# Patient Record
Sex: Male | Born: 1970 | Race: White | Hispanic: No | Marital: Married | Smoking: Former smoker
Health system: Southern US, Community
[De-identification: ages and names within clinical notes are randomized; demographics above are authoritative.]

## PROBLEM LIST (undated history)

## (undated) DIAGNOSIS — F32A Depression, unspecified: Secondary | ICD-10-CM

## (undated) DIAGNOSIS — F419 Anxiety disorder, unspecified: Secondary | ICD-10-CM

## (undated) DIAGNOSIS — Z789 Other specified health status: Secondary | ICD-10-CM

## (undated) HISTORY — PX: HERNIA REPAIR: SHX51

## (undated) HISTORY — DX: Anxiety disorder, unspecified: F41.9

## (undated) HISTORY — DX: Depression, unspecified: F32.A

## (undated) HISTORY — PX: WISDOM TOOTH EXTRACTION: SHX21

---

## 2013-09-22 ENCOUNTER — Encounter (HOSPITAL_BASED_OUTPATIENT_CLINIC_OR_DEPARTMENT_OTHER): Payer: Self-pay | Admitting: Emergency Medicine

## 2013-09-22 ENCOUNTER — Emergency Department (HOSPITAL_BASED_OUTPATIENT_CLINIC_OR_DEPARTMENT_OTHER)
Admission: EM | Admit: 2013-09-22 | Discharge: 2013-09-22 | Disposition: A | Discharge status: Home / Self Care | Payer: BC Managed Care – PPO | Attending: Emergency Medicine | Admitting: Emergency Medicine

## 2013-09-22 DIAGNOSIS — IMO0002 Reserved for concepts with insufficient information to code with codable children: Secondary | ICD-10-CM | POA: Insufficient documentation

## 2013-09-22 DIAGNOSIS — Z791 Long term (current) use of non-steroidal anti-inflammatories (NSAID): Secondary | ICD-10-CM | POA: Insufficient documentation

## 2013-09-22 DIAGNOSIS — R209 Unspecified disturbances of skin sensation: Secondary | ICD-10-CM | POA: Insufficient documentation

## 2013-09-22 DIAGNOSIS — M5417 Radiculopathy, lumbosacral region: Secondary | ICD-10-CM

## 2013-09-22 MED ORDER — PREDNISONE 50 MG PO TABS
60.0000 mg | ORAL_TABLET | Freq: Once | ORAL | Status: AC
  Administered 2013-09-22: 60 mg via ORAL
  Filled 2013-09-22 (×2): qty 1

## 2013-09-22 MED ORDER — DEXAMETHASONE SODIUM PHOSPHATE 10 MG/ML IJ SOLN: 10.0000 mg | Freq: Once | INTRAMUSCULAR | Status: DC

## 2013-09-22 MED ORDER — HYDROCODONE-ACETAMINOPHEN 5-325 MG PO TABS: ORAL_TABLET | ORAL | Status: DC

## 2013-09-22 MED ORDER — PREDNISONE 20 MG PO TABS: ORAL_TABLET | ORAL | Status: DC

## 2013-09-22 MED ORDER — MORPHINE SULFATE 4 MG/ML IJ SOLN
4.0000 mg | Freq: Once | INTRAMUSCULAR | Status: AC
  Administered 2013-09-22: 4 mg via INTRAVENOUS
  Filled 2013-09-22: qty 1

## 2013-09-22 NOTE — Discharge Instructions (Signed)
Take vicodin for breakthrough pain, do not drink alcohol, drive, care for children or do other critical tasks while taking vicodin. ° °Please follow with your primary care doctor in the next 2 days for a check-up. They must obtain records for further management.  ° °Do not hesitate to return to the Emergency Department for any new, worsening or concerning symptoms.  ° °

## 2013-09-22 NOTE — ED Notes (Signed)
Pt presents to ED with complaints of right lower back, and  rt leg pain and rt foot numbness, about a month now.

## 2013-09-22 NOTE — ED Notes (Signed)
PA at bedside.

## 2013-09-22 NOTE — ED Provider Notes (Signed)
CSN: [id number]     Arrival date & time 09/22/13  1458 History   First MD Initiated Contact with Patient 09/22/13 1620     Chief Complaint  Patient presents with  . Back Pain  . Leg Pain  . Numbness     (Consider location/radiation/quality/duration/timing/severity/associated sxs/prior Treatment) HPI  Jordan Braun is a 43 y.o. male complaining of right leg pain and a pins and needles paresthesia to the right foot. Initially patient had low back pain approximately one month ago, this resolved 2 weeks ago and developed into the pain in the leg. Initially the pain was on the posterior side is now on the posterior and anterior side. Patient is ambulatory but states is painful. Pain is also exacerbated by sitting especially in the car. Patient has been taking naproxen at home with little relief. He rates his pain at 8/10. Denies fever, chills, change in bowel or bladder habits, h/o IDVU or cancer or weakness.    History reviewed. No pertinent past medical history. History reviewed. No pertinent past surgical history. History reviewed. No pertinent family history. History  Substance Use Topics  . Smoking status: Never Smoker   . Smokeless tobacco: Not on file  . Alcohol Use: Not on file    Review of Systems  10 systems reviewed and found to be negative, except as noted in the HPI.   Allergies  Review of patient's allergies indicates no known allergies.  Home Medications   Prior to Admission medications   Medication Sig Start Date End Date Taking? Authorizing Provider  naproxen sodium (ANAPROX) 220 MG tablet Take 220 mg by mouth 2 (two) times daily with a meal.   Yes Historical Provider, MD  HYDROcodone-acetaminophen (NORCO/VICODIN) 5-325 MG per tablet Take 1-2 tablets by mouth every 6 hours as needed for pain. 09/22/13   [name], PA-C  predniSONE (DELTASONE) 20 MG tablet 3 tabs po daily x 3 days, then 2 tabs x 3 days, then 1.5 tabs x 3 days, then 1 tab x 3 days, then 0.5  tabs x 3 days 09/22/13   [name], PA-C   BP 132/80  Pulse 60  Temp(Src) 97.6 F (36.4 C) (Oral)  Resp 14  Ht 6' (1.829 m)  Wt 185 lb (83.915 kg)  BMI 25.08 kg/m2  SpO2 98% Physical Exam  Nursing note and vitals reviewed. Constitutional: He is oriented to person, place, and time. He appears well-developed and well-nourished. No distress.  HENT:  Head: Normocephalic.  Mouth/Throat: Oropharynx is clear and moist.  Eyes: Conjunctivae and EOM are normal.  Cardiovascular: Normal rate, regular rhythm and intact distal pulses.   Pulmonary/Chest: Effort normal and breath sounds normal. No stridor.  Abdominal: Soft. Bowel sounds are normal.  Musculoskeletal: Normal range of motion.  No point tenderness to percussion of lumbar spinal processes.  No TTP or paraspinal muscular spasm. Strength is 5 out of 5 to bilateral lower extremities at hip and knee; extensor hallucis longus 2/5 on the right side, 5 out of 5 on the left side. Ankle strength 5 out of 5, no clonus, neurovascularly intact. No saddle anaesthesia. Patellar reflexes are 2+ bilaterally.    Normal rectal tone (exam chaperoned by technician) Strait leg raise is positive on the ipsilateral (right) side at approximately 40, negative on the contralateral side Ambulates with a coordinated gait   Neurological: He is alert and oriented to person, place, and time.  Psychiatric: He has a normal mood and affect.    ED Course  Procedures (including  critical care time) Labs Review Labs Reviewed - No data to display  Imaging Review No results found.   EKG Interpretation None      MDM   Final diagnoses:  Lumbosacral radiculopathy at L5    Filed Vitals:   09/22/13 1506 09/22/13 1752  BP: 142/85 132/80  Pulse: 73 60  Temp: 97.6 F (36.4 C)   TempSrc: Oral   Resp: 18 14  Height: 6' (1.829 m)   Weight: 185 lb (83.915 kg)   SpO2: 97% 98%    Medications  morphine 4 MG/ML injection 4 mg (4 mg Intravenous Given  09/22/13 1705)  predniSONE (DELTASONE) tablet 60 mg (60 mg Oral Given 09/22/13 1705)    Jordan Braun is a 43 y.o. male presenting with  back pain.  Patient can walk but states is painful.  No loss of bowel or bladder control.  No concern for cauda equina.  No fever, night sweats, weight loss, h/o cancer, IVDU.  RICE protocol and pain medicine indicated and discussed with patient.  Discussed weakness with attending physician Dr. Fonnie JarvisBednar. This is not present in the raise red flag for spinal cord issues for him and he recommends symptomatic treatment and followup as an outpatient.  Evaluation does not show pathology that would require ongoing emergent intervention or inpatient treatment. Pt is hemodynamically stable and mentating appropriately. Discussed findings and plan with patient/guardian, who agrees with care plan. All questions answered. Return precautions discussed and outpatient follow up given.   Discharge Medication List as of 09/22/2013  5:46 PM    START taking these medications   Details  HYDROcodone-acetaminophen (NORCO/VICODIN) 5-325 MG per tablet Take 1-2 tablets by mouth every 6 hours as needed for pain., Print    predniSONE (DELTASONE) 20 MG tablet 3 tabs po daily x 3 days, then 2 tabs x 3 days, then 1.5 tabs x 3 days, then 1 tab x 3 days, then 0.5 tabs x 3 days, Print             [name], PA-C 09/22/13 1822

## 2013-09-23 NOTE — ED Provider Notes (Signed)
Medical screening examination/treatment/procedure(s) were performed by non-physician practitioner and as supervising physician I was immediately available for consultation/collaboration.   EKG Interpretation None       Hurman HornJohn M Yaremi Stahlman, MD 09/23/13 2142

## 2013-10-14 ENCOUNTER — Other Ambulatory Visit (HOSPITAL_COMMUNITY): Payer: Self-pay | Admitting: Orthopaedic Surgery

## 2013-10-22 ENCOUNTER — Encounter (HOSPITAL_COMMUNITY): Payer: Self-pay | Admitting: Pharmacy Technician

## 2013-10-24 ENCOUNTER — Ambulatory Visit (HOSPITAL_COMMUNITY)
Admission: RE | Admit: 2013-10-24 | Discharge: 2013-10-24 | Disposition: A | Discharge status: Home / Self Care | Payer: BC Managed Care – PPO | Source: Ambulatory Visit | Attending: Orthopaedic Surgery | Admitting: Orthopaedic Surgery

## 2013-10-24 ENCOUNTER — Encounter (HOSPITAL_COMMUNITY)
Admission: RE | Admit: 2013-10-24 | Discharge: 2013-10-24 | Disposition: A | Discharge status: Home / Self Care | Payer: BC Managed Care – PPO | Source: Ambulatory Visit | Attending: Orthopaedic Surgery | Admitting: Orthopaedic Surgery

## 2013-10-24 ENCOUNTER — Encounter (HOSPITAL_COMMUNITY): Payer: Self-pay

## 2013-10-24 ENCOUNTER — Other Ambulatory Visit (HOSPITAL_COMMUNITY): Payer: Self-pay | Admitting: *Deleted

## 2013-10-24 DIAGNOSIS — Z01818 Encounter for other preprocedural examination: Secondary | ICD-10-CM | POA: Insufficient documentation

## 2013-10-24 HISTORY — DX: Other specified health status: Z78.9

## 2013-10-24 LAB — SURGICAL PCR SCREEN
MRSA, PCR: NEGATIVE
Staphylococcus aureus: NEGATIVE

## 2013-10-24 NOTE — Progress Notes (Signed)
10/24/13 1516  OBSTRUCTIVE SLEEP APNEA  Have you ever been diagnosed with sleep apnea through a sleep study? No  Do you snore loudly (loud enough to be heard through closed doors)?  1  Do you often feel tired, fatigued, or sleepy during the daytime? 1  Has anyone observed you stop breathing during your sleep? 1  Do you have, or are you being treated for high blood pressure? 0  BMI more than 35 kg/m2? 0  Age over 43 years old? 0  Neck circumference greater than 40 cm/16 inches? 0 (16)  Gender: 1  Obstructive Sleep Apnea Score 4  Score 4 or greater  Results sent to PCP

## 2013-10-24 NOTE — Progress Notes (Signed)
Pt arrived for PAT appt stating that his surgery date was going to be changing, not sure if it's to be 11/06/13 or 11/08/13. States it definitely will not be 11/01/13. Went ahead with his PAT appt, but did not have lab work done. Pt will come in a couple of days ahead of surgery date to have lab work done. Instructed pt to stop by any time between the hours of 8 AM and 4PM Monday thru Friday. He did have his EKG, CXR and PCR done while here today.

## 2013-10-24 NOTE — Pre-Procedure Instructions (Signed)
Jordan Braun  10/24/2013   Your procedure is scheduled on:  Friday, November 01, 2013 at 7:30 AM.   [facility] at 5:30 AM.   Call this number if you have problems the morning of surgery: [phone]   Remember:   Do not eat food or drink liquids after midnight Thursday, 10/31/13.   Take these medicines the morning of surgery with A SIP OF WATER: None  Stop Naproxen as of tomorrow, 10/25/13. Do not use any Aspirin products or NSAIDS (Ibuprofen, Aleve, etc) prior to surgery.   Do not wear jewelry.  Do not wear lotions, powders, or cologne. You may wear deodorant.  Men may shave face and neck.  Do not bring valuables to the hospital.  [facility] is not responsible                  for any belongings or valuables.               Contacts, dentures or bridgework may not be worn into surgery.  Leave suitcase in the car. After surgery it may be brought to your room.  For patients admitted to the hospital, discharge time is determined by your                treatment team.               Special Instructions: [facility] - Preparing for Surgery  Before surgery, you can play an important role.  Because skin is not sterile, your skin needs to be as free of germs as possible.  You can reduce the number of germs on you skin by washing with CHG (chlorahexidine gluconate) soap before surgery.  CHG is an antiseptic cleaner which kills germs and bonds with the skin to continue killing germs even after washing.  Please DO NOT use if you have an allergy to CHG or antibacterial soaps.  If your skin becomes reddened/irritated stop using the CHG and inform your nurse when you arrive at Short Stay.  Do not shave (including legs and underarms) for at least 48 hours prior to the first CHG shower.  You may shave your face.  Please follow these instructions carefully:   1.  Shower with CHG Soap the night before surgery and the                                 morning of Surgery.  2.  If you choose to wash your hair, wash your hair first as usual with your       normal shampoo.  3.  After you shampoo, rinse your hair and body thoroughly to remove the                      Shampoo.  4.  Use CHG as you would any other liquid soap.  You can apply chg directly       to the skin and wash gently with scrungie or a clean washcloth.  5.  Apply the CHG Soap to your body ONLY FROM THE NECK DOWN.        Do not use on open wounds or open sores.  Avoid contact with your eyes, ears, mouth and genitals (private parts).  Wash genitals (private parts) with your normal soap.  6.  Wash thoroughly, paying special attention to the area where your surgery  will be performed.  7.  Thoroughly rinse your body with warm water from the neck down.  8.  DO NOT shower/wash with your normal soap after using and rinsing off       the CHG Soap.  9.  Pat yourself dry with a clean towel.            10.  Wear clean pajamas.            11.  Place clean sheets on your bed the night of your first shower and do not        sleep with pets.  Day of Surgery  Do not apply any lotions the morning of surgery.  Please wear clean clothes to the hospital/surgery center.     Please read over the following fact sheets that you were given: Pain Booklet, Coughing and Deep Breathing, MRSA Information and Surgical Site Infection Prevention

## 2013-10-25 NOTE — Progress Notes (Signed)
Called pt with new surgery date and time. Instructed pt to be here on 11/06/13 at 1 PM for a 3 PM surgery start time. Also reminded him to come in 1-2 days prior to get lab work done. He voiced understanding.

## 2013-11-01 NOTE — H&P (Signed)
Jordan Braun is an 43 y.o. male.   Chief Complaint:  Back pain, right leg pain, acute right foot drop. HPI:  He has had onset of back pain, right leg pain and right foot drop that began about 2 weeks ago, it has progressed.  He is taking a Medrol Dosepak and Vicodin from the emergency room.  The pain started in the buttock.  He has been catching his foot when he walks.  He has to be careful when he goes up steps.  He has had some difficulty sleeping.   He is on no other medications.  He states he did have some problems with back pain and left leg pain many years ago, but states it only bothered him for 2 or 3 days and then was totally gone.   RADIOGRAPHS:  Data reviewed of MRI from 10/01/2013 and reviewed with the patient today.  This shows significant L4-L5 and L5-S1 disk bulge most significant at L5-S1.  The L5-S1 disk measures 1.6 x 1.5 x 1.8 cm and there is approximately 9 mm of inferior migration that is compressing the right S1 nerve root in the right lateral recess. Pt wishes to proceed with surgical decompression.    Past Medical History  Diagnosis Date  . Medical history non-contributory     Past Surgical History  Procedure Laterality Date  . Hernia repair      right inguinal hernia at age 59  . Wisdom tooth extraction      Family History  Problem Relation Age of Onset  . Epilepsy Mother   . Prostate cancer Father   . Heart attack Father   . Arthritis Father    Social History:  reports that he quit smoking about 5 years ago. His smoking use included Cigarettes. He smoked 0.00 packs per day. He has quit using smokeless tobacco. His smokeless tobacco use included Chew. He reports that he drinks alcohol. He reports that he does not use illicit drugs.  Allergies: No Known Allergies  No prescriptions prior to admission    No results found for this or any previous visit (from the past 48 hour(s)). No results found.  Review of Systems  Constitutional: Negative.   HENT:  Negative.   Eyes: Negative.   Respiratory: Negative.   Cardiovascular: Negative.   Gastrointestinal: Negative.   Genitourinary: Negative.   Musculoskeletal: Positive for back pain.  Skin: Negative.   Neurological: Positive for tingling and focal weakness.       Right foot with right foot drop  Endo/Heme/Allergies: Negative.     There were no vitals taken for this visit. Physical Exam  Musculoskeletal:  He is 6 feet tall and weighs 185 pounds.  Lower extremity reflexes are 2+ ankle jerk, 2+ knee jerk.  He has a foot drop on the right with a trace anterior tib I can overcome with 1 finger.  He cannot dorsiflex the EHL.  He has good toe flexors and the gastrocsoleus is normal.  He is able to heel walk on the opposite left leg but on the right he has a foot drop gait, slapping the foot.  He can toe walk both legs easily with no gastroc weakness.  The patient has sciatic notch tenderness and positive straight leg raising at 45 degrees.      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

## 2013-11-04 ENCOUNTER — Encounter (HOSPITAL_COMMUNITY)
Admission: RE | Admit: 2013-11-04 | Discharge: 2013-11-04 | Disposition: A | Discharge status: Home / Self Care | Payer: BC Managed Care – PPO | Source: Ambulatory Visit | Attending: Orthopaedic Surgery | Admitting: Orthopaedic Surgery

## 2013-11-04 DIAGNOSIS — M5126 Other intervertebral disc displacement, lumbar region: Secondary | ICD-10-CM | POA: Diagnosis present

## 2013-11-04 LAB — COMPREHENSIVE METABOLIC PANEL
ALT: 10 U/L (ref 0–53)
AST: 13 U/L (ref 0–37)
Albumin: 3.9 g/dL (ref 3.5–5.2)
Alkaline Phosphatase: 63 U/L (ref 39–117)
Anion gap: 9 (ref 5–15)
BUN: 11 mg/dL (ref 6–23)
CALCIUM: 9 mg/dL (ref 8.4–10.5)
CO2: 27 meq/L (ref 19–32)
Chloride: 105 mEq/L (ref 96–112)
Creatinine, Ser: 0.98 mg/dL (ref 0.50–1.35)
GFR calc Af Amer: >90 mL/min (ref >90)
GFR calc non Af Amer: >90 mL/min (ref >90)
Glucose, Bld: 97 mg/dL (ref 70–99)
Potassium: 4.4 mEq/L (ref 3.7–5.3)
Sodium: 141 mEq/L (ref 137–147)
TOTAL PROTEIN: 6.8 g/dL (ref 6.0–8.3)
Total Bilirubin: 0.2 mg/dL — ABNORMAL LOW (ref 0.3–1.2)

## 2013-11-04 LAB — CBC
HCT: 44.1 % (ref 39.0–52.0)
Hemoglobin: 14.9 g/dL (ref 13.0–17.0)
MCH: 29.6 pg (ref 26.0–34.0)
MCHC: 33.8 g/dL (ref 30.0–36.0)
MCV: 87.7 fL (ref 78.0–100.0)
PLATELETS: 229 10*3/uL (ref 150–400)
RBC: 5.03 MIL/uL (ref 4.22–5.81)
RDW: 12.4 % (ref 11.5–15.5)
WBC: 5.5 10*3/uL (ref 4.0–10.5)

## 2013-11-04 LAB — PROTIME-INR
INR: 0.96 (ref 0.00–1.49)
PROTHROMBIN TIME: 12.8 s (ref 11.6–15.2)

## 2013-11-05 MED ORDER — CEFAZOLIN SODIUM-DEXTROSE 2-3 GM-% IV SOLR
2.0000 g | INTRAVENOUS | Status: AC
  Administered 2013-11-06: 2 g via INTRAVENOUS
  Filled 2013-11-05: qty 50

## 2013-11-06 ENCOUNTER — Encounter (HOSPITAL_COMMUNITY): Payer: BC Managed Care – PPO | Admitting: Anesthesiology

## 2013-11-06 ENCOUNTER — Ambulatory Visit (HOSPITAL_COMMUNITY): Payer: BC Managed Care – PPO | Admitting: Anesthesiology

## 2013-11-06 ENCOUNTER — Encounter (HOSPITAL_COMMUNITY)
Admission: RE | Disposition: A | Discharge status: Home / Self Care | Payer: Self-pay | Source: Ambulatory Visit | Attending: Orthopaedic Surgery

## 2013-11-06 ENCOUNTER — Ambulatory Visit (HOSPITAL_COMMUNITY): Payer: BC Managed Care – PPO

## 2013-11-06 ENCOUNTER — Observation Stay (HOSPITAL_COMMUNITY)
Admission: RE | Admit: 2013-11-06 | Discharge: 2013-11-07 | Disposition: A | Discharge status: Home / Self Care | Payer: BC Managed Care – PPO | Source: Ambulatory Visit | Attending: Orthopaedic Surgery | Admitting: Orthopaedic Surgery

## 2013-11-06 ENCOUNTER — Encounter (HOSPITAL_COMMUNITY): Payer: Self-pay | Admitting: *Deleted

## 2013-11-06 DIAGNOSIS — M5126 Other intervertebral disc displacement, lumbar region: Principal | ICD-10-CM | POA: Diagnosis present

## 2013-11-06 HISTORY — PX: LUMBAR LAMINECTOMY: SHX95

## 2013-11-06 SURGERY — MICRODISCECTOMY LUMBAR LAMINECTOMY
Anesthesia: General | Site: Back | Laterality: Right

## 2013-11-06 MED ORDER — OXYCODONE-ACETAMINOPHEN 5-325 MG PO TABS: 1.0000 | ORAL_TABLET | ORAL | Status: DC | PRN

## 2013-11-06 MED ORDER — LIDOCAINE HCL (CARDIAC) 20 MG/ML IV SOLN
INTRAVENOUS | Status: DC | PRN
  Administered 2013-11-06: 70 mg via INTRAVENOUS

## 2013-11-06 MED ORDER — FENTANYL CITRATE 0.05 MG/ML IJ SOLN
INTRAMUSCULAR | Status: DC | PRN
  Administered 2013-11-06: 50 ug via INTRAVENOUS
  Administered 2013-11-06: 100 ug via INTRAVENOUS
  Administered 2013-11-06 (×2): 50 ug via INTRAVENOUS

## 2013-11-06 MED ORDER — POLYETHYLENE GLYCOL 3350 17 G PO PACK: 17.0000 g | PACK | Freq: Every day | ORAL | Status: DC | PRN

## 2013-11-06 MED ORDER — MIDAZOLAM HCL 5 MG/5ML IJ SOLN
INTRAMUSCULAR | Status: DC | PRN
  Administered 2013-11-06: 2 mg via INTRAVENOUS

## 2013-11-06 MED ORDER — NEOSTIGMINE METHYLSULFATE 10 MG/10ML IV SOLN
INTRAVENOUS | Status: DC | PRN
  Administered 2013-11-06: 4 mg via INTRAVENOUS

## 2013-11-06 MED ORDER — ACETAMINOPHEN 325 MG PO TABS: 650.0000 mg | ORAL_TABLET | ORAL | Status: DC | PRN

## 2013-11-06 MED ORDER — MIDAZOLAM HCL 2 MG/2ML IJ SOLN
INTRAMUSCULAR | Status: AC
  Filled 2013-11-06: qty 2

## 2013-11-06 MED ORDER — SODIUM CHLORIDE 0.9 % IJ SOLN: 3.0000 mL | Freq: Two times a day (BID) | INTRAMUSCULAR | Status: DC

## 2013-11-06 MED ORDER — METHOCARBAMOL 500 MG PO TABS: 500.0000 mg | ORAL_TABLET | Freq: Four times a day (QID) | ORAL | Status: DC | PRN

## 2013-11-06 MED ORDER — ARTIFICIAL TEARS OP OINT
TOPICAL_OINTMENT | OPHTHALMIC | Status: DC | PRN
  Administered 2013-11-06: 1 via OPHTHALMIC

## 2013-11-06 MED ORDER — KETOROLAC TROMETHAMINE 30 MG/ML IJ SOLN
30.0000 mg | Freq: Four times a day (QID) | INTRAMUSCULAR | Status: DC
  Administered 2013-11-06 – 2013-11-07 (×3): 30 mg via INTRAVENOUS
  Filled 2013-11-06 (×6): qty 1

## 2013-11-06 MED ORDER — FENTANYL CITRATE 0.05 MG/ML IJ SOLN
INTRAMUSCULAR | Status: AC
Start: 2013-11-06 — End: 2013-11-06
  Filled 2013-11-06: qty 5

## 2013-11-06 MED ORDER — PHENOL 1.4 % MT LIQD: 1.0000 | OROMUCOSAL | Status: DC | PRN

## 2013-11-06 MED ORDER — CHLORHEXIDINE GLUCONATE 4 % EX LIQD
60.0000 mL | Freq: Once | CUTANEOUS | Status: DC
  Filled 2013-11-06: qty 60

## 2013-11-06 MED ORDER — LACTATED RINGERS IV SOLN
INTRAVENOUS | Status: DC | PRN
  Administered 2013-11-06 (×2): via INTRAVENOUS

## 2013-11-06 MED ORDER — 0.9 % SODIUM CHLORIDE (POUR BTL) OPTIME
TOPICAL | Status: DC | PRN
  Administered 2013-11-06: 1000 mL

## 2013-11-06 MED ORDER — GLYCOPYRROLATE 0.2 MG/ML IJ SOLN
INTRAMUSCULAR | Status: AC
  Filled 2013-11-06: qty 3

## 2013-11-06 MED ORDER — ROCURONIUM BROMIDE 50 MG/5ML IV SOLN
INTRAVENOUS | Status: AC
  Filled 2013-11-06: qty 1

## 2013-11-06 MED ORDER — ONDANSETRON HCL 4 MG/2ML IJ SOLN
INTRAMUSCULAR | Status: AC
  Filled 2013-11-06: qty 2

## 2013-11-06 MED ORDER — ACETAMINOPHEN 650 MG RE SUPP: 650.0000 mg | RECTAL | Status: DC | PRN

## 2013-11-06 MED ORDER — BUPIVACAINE HCL (PF) 0.25 % IJ SOLN
INTRAMUSCULAR | Status: AC
  Filled 2013-11-06: qty 30

## 2013-11-06 MED ORDER — KCL IN DEXTROSE-NACL 20-5-0.45 MEQ/L-%-% IV SOLN
INTRAVENOUS | Status: DC
  Administered 2013-11-07: 01:00:00 via INTRAVENOUS
  Filled 2013-11-06 (×5): qty 1000

## 2013-11-06 MED ORDER — METHOCARBAMOL 1000 MG/10ML IJ SOLN
500.0000 mg | Freq: Four times a day (QID) | INTRAVENOUS | Status: DC | PRN
  Filled 2013-11-06: qty 5

## 2013-11-06 MED ORDER — ONDANSETRON HCL 4 MG/2ML IJ SOLN
INTRAMUSCULAR | Status: DC | PRN
  Administered 2013-11-06: 4 mg via INTRAVENOUS

## 2013-11-06 MED ORDER — HYDROMORPHONE HCL PF 1 MG/ML IJ SOLN
INTRAMUSCULAR | Status: AC
  Administered 2013-11-06: 0.5 mg via INTRAVENOUS
  Filled 2013-11-06: qty 1

## 2013-11-06 MED ORDER — SODIUM CHLORIDE 0.9 % IV SOLN: 250.0000 mL | INTRAVENOUS | Status: DC

## 2013-11-06 MED ORDER — BISACODYL 10 MG RE SUPP: 10.0000 mg | Freq: Every day | RECTAL | Status: DC | PRN

## 2013-11-06 MED ORDER — GLYCOPYRROLATE 0.2 MG/ML IJ SOLN
INTRAMUSCULAR | Status: DC | PRN
  Administered 2013-11-06: .6 mg via INTRAVENOUS

## 2013-11-06 MED ORDER — ROCURONIUM BROMIDE 100 MG/10ML IV SOLN
INTRAVENOUS | Status: DC | PRN
  Administered 2013-11-06: 40 mg via INTRAVENOUS

## 2013-11-06 MED ORDER — PANTOPRAZOLE SODIUM 40 MG IV SOLR
40.0000 mg | Freq: Every day | INTRAVENOUS | Status: DC
  Administered 2013-11-06: 40 mg via INTRAVENOUS
  Filled 2013-11-06 (×2): qty 40

## 2013-11-06 MED ORDER — PROPOFOL 10 MG/ML IV BOLUS
INTRAVENOUS | Status: DC | PRN
  Administered 2013-11-06: 180 mg via INTRAVENOUS

## 2013-11-06 MED ORDER — LACTATED RINGERS IV SOLN
INTRAVENOUS | Status: DC
  Administered 2013-11-06: 13:00:00 via INTRAVENOUS

## 2013-11-06 MED ORDER — FLEET ENEMA 7-19 GM/118ML RE ENEM
1.0000 | ENEMA | Freq: Once | RECTAL | Status: AC | PRN
  Filled 2013-11-06: qty 1

## 2013-11-06 MED ORDER — NEOSTIGMINE METHYLSULFATE 10 MG/10ML IV SOLN
INTRAVENOUS | Status: AC
  Filled 2013-11-06: qty 3

## 2013-11-06 MED ORDER — HYDROMORPHONE HCL PF 1 MG/ML IJ SOLN
0.2500 mg | INTRAMUSCULAR | Status: DC | PRN
  Administered 2013-11-06 (×2): 0.5 mg via INTRAVENOUS

## 2013-11-06 MED ORDER — MORPHINE SULFATE 2 MG/ML IJ SOLN
1.0000 mg | INTRAMUSCULAR | Status: DC | PRN
  Administered 2013-11-06: 2 mg via INTRAVENOUS
  Filled 2013-11-06: qty 1

## 2013-11-06 MED ORDER — METHOCARBAMOL 500 MG PO TABS
500.0000 mg | ORAL_TABLET | Freq: Four times a day (QID) | ORAL | Status: DC | PRN
  Administered 2013-11-07: 500 mg via ORAL
  Filled 2013-11-06 (×2): qty 1

## 2013-11-06 MED ORDER — MENTHOL 3 MG MT LOZG: 1.0000 | LOZENGE | OROMUCOSAL | Status: DC | PRN

## 2013-11-06 MED ORDER — ONDANSETRON HCL 4 MG/2ML IJ SOLN: 4.0000 mg | INTRAMUSCULAR | Status: DC | PRN

## 2013-11-06 MED ORDER — DOCUSATE SODIUM 100 MG PO CAPS
100.0000 mg | ORAL_CAPSULE | Freq: Two times a day (BID) | ORAL | Status: DC
  Administered 2013-11-06: 100 mg via ORAL
  Filled 2013-11-06: qty 1

## 2013-11-06 MED ORDER — KETOROLAC TROMETHAMINE 30 MG/ML IJ SOLN
INTRAMUSCULAR | Status: AC
Start: 2013-11-06 — End: 2013-11-07
  Filled 2013-11-06: qty 1

## 2013-11-06 MED ORDER — SODIUM CHLORIDE 0.9 % IJ SOLN: 3.0000 mL | INTRAMUSCULAR | Status: DC | PRN

## 2013-11-06 MED ORDER — CEFAZOLIN SODIUM 1-5 GM-% IV SOLN
1.0000 g | Freq: Three times a day (TID) | INTRAVENOUS | Status: AC
  Administered 2013-11-06 – 2013-11-07 (×2): 1 g via INTRAVENOUS
  Filled 2013-11-06 (×2): qty 50

## 2013-11-06 MED ORDER — HYDROCODONE-ACETAMINOPHEN 5-325 MG PO TABS
1.0000 | ORAL_TABLET | ORAL | Status: DC | PRN
  Administered 2013-11-07: 1 via ORAL
  Administered 2013-11-07: 2 via ORAL
  Filled 2013-11-06: qty 2

## 2013-11-06 MED ORDER — BUPIVACAINE HCL (PF) 0.25 % IJ SOLN
INTRAMUSCULAR | Status: DC | PRN
  Administered 2013-11-06: 20 mL

## 2013-11-06 SURGICAL SUPPLY — 50 items
BUR ROUND FLUTED 4 SOFT TCH (BURR) ×2 IMPLANT
BUR ROUND FLUTED 4MM SOFT TCH (BURR) ×1
CORDS BIPOLAR (ELECTRODE) ×3 IMPLANT
COVER SURGICAL LIGHT HANDLE (MISCELLANEOUS) ×3 IMPLANT
DERMABOND ADHESIVE PROPEN (GAUZE/BANDAGES/DRESSINGS) ×2
DERMABOND ADVANCED (GAUZE/BANDAGES/DRESSINGS) ×2
DERMABOND ADVANCED .7 DNX12 (GAUZE/BANDAGES/DRESSINGS) ×1 IMPLANT
DERMABOND ADVANCED .7 DNX6 (GAUZE/BANDAGES/DRESSINGS) ×1 IMPLANT
DRAPE MICROSCOPE LEICA (MISCELLANEOUS) ×3 IMPLANT
DRAPE PROXIMA HALF (DRAPES) ×6 IMPLANT
DRSG ADAPTIC 3X8 NADH LF (GAUZE/BANDAGES/DRESSINGS) ×3 IMPLANT
DRSG EMULSION OIL 3X3 NADH (GAUZE/BANDAGES/DRESSINGS) ×3 IMPLANT
DRSG MEPILEX BORDER 4X4 (GAUZE/BANDAGES/DRESSINGS) ×3 IMPLANT
DRSG MEPILEX BORDER 4X8 (GAUZE/BANDAGES/DRESSINGS) ×3 IMPLANT
DURAPREP 26ML APPLICATOR (WOUND CARE) ×3 IMPLANT
DURASEAL SPINE SEALANT 3ML (MISCELLANEOUS) IMPLANT
ELECT REM PT RETURN 9FT ADLT (ELECTROSURGICAL) ×3
ELECTRODE REM PT RTRN 9FT ADLT (ELECTROSURGICAL) ×1 IMPLANT
GAUZE SPONGE 4X4 12PLY STRL (GAUZE/BANDAGES/DRESSINGS) ×3 IMPLANT
GLOVE BIOGEL PI IND STRL 7.5 (GLOVE) ×1 IMPLANT
GLOVE BIOGEL PI IND STRL 8 (GLOVE) ×1 IMPLANT
GLOVE BIOGEL PI INDICATOR 7.5 (GLOVE) ×2
GLOVE BIOGEL PI INDICATOR 8 (GLOVE) ×2
GLOVE ECLIPSE 7.0 STRL STRAW (GLOVE) ×3 IMPLANT
GLOVE ORTHO TXT STRL SZ7.5 (GLOVE) ×3 IMPLANT
GOWN STRL REUS W/ TWL LRG LVL3 (GOWN DISPOSABLE) ×4 IMPLANT
GOWN STRL REUS W/TWL LRG LVL3 (GOWN DISPOSABLE) ×8
KIT BASIN OR (CUSTOM PROCEDURE TRAY) ×3 IMPLANT
KIT ROOM TURNOVER OR (KITS) ×3 IMPLANT
MANIFOLD NEPTUNE II (INSTRUMENTS) IMPLANT
NDL SUT .5 MAYO 1.404X.05X (NEEDLE) ×1 IMPLANT
NEEDLE 22X1 1/2 (OR ONLY) (NEEDLE) ×3 IMPLANT
NEEDLE MAYO TAPER (NEEDLE) ×2
NEEDLE SPNL 18GX3.5 QUINCKE PK (NEEDLE) ×3 IMPLANT
NS IRRIG 1000ML POUR BTL (IV SOLUTION) ×3 IMPLANT
PACK LAMINECTOMY ORTHO (CUSTOM PROCEDURE TRAY) ×3 IMPLANT
PAD ARMBOARD 7.5X6 YLW CONV (MISCELLANEOUS) ×6 IMPLANT
PATTIES SURGICAL .5 X.5 (GAUZE/BANDAGES/DRESSINGS) ×3 IMPLANT
PATTIES SURGICAL .75X.75 (GAUZE/BANDAGES/DRESSINGS) IMPLANT
SPONGE GAUZE 4X4 12PLY STER LF (GAUZE/BANDAGES/DRESSINGS) ×3 IMPLANT
SUT VIC AB 2-0 CT1 27 (SUTURE) ×2
SUT VIC AB 2-0 CT1 TAPERPNT 27 (SUTURE) ×1 IMPLANT
SUT VICRYL 0 TIES 12 18 (SUTURE) ×3 IMPLANT
SUT VICRYL 4-0 PS2 18IN ABS (SUTURE) IMPLANT
SUT VICRYL AB 2 0 TIES (SUTURE) ×3 IMPLANT
SYR 20ML ECCENTRIC (SYRINGE) IMPLANT
SYR CONTROL 10ML LL (SYRINGE) ×3 IMPLANT
TOWEL OR 17X24 6PK STRL BLUE (TOWEL DISPOSABLE) ×3 IMPLANT
TOWEL OR 17X26 10 PK STRL BLUE (TOWEL DISPOSABLE) ×3 IMPLANT
WATER STERILE IRR 1000ML POUR (IV SOLUTION) IMPLANT

## 2013-11-06 NOTE — Brief Op Note (Cosign Needed)
11/06/2013  4:16 PM  PATIENT:  Jordan Braun  43 y.o. male  PRE-OPERATIVE DIAGNOSIS:  Right L5-S1 HNP  POST-OPERATIVE DIAGNOSIS:  Right L5-S1 HNP  PROCEDURE:  Procedure(s) with comments: MICRODISCECTOMY LUMBAR LAMINECTOMY (Right) - Right L5-S1 Microdiscectomy  SURGEON:  Surgeon(s) and Role:    * Eldred Manges, MD - Primary  PHYSICIAN ASSISTANT: Maud Deed PAC  ASSISTANTS: none   ANESTHESIA:   general  EBL:  Total I/O In: 1000 [I.V.:1000] Out: 50 [Blood:50]  BLOOD ADMINISTERED:none  DRAINS: none   LOCAL MEDICATIONS USED:  MARCAINE     SPECIMEN:  No Specimen  DISPOSITION OF SPECIMEN:  N/A  COUNTS:  YES  TOURNIQUET:  * No tourniquets in log *  DICTATION: .Note written in EPIC  PLAN OF CARE: Admit for overnight observation  PATIENT DISPOSITION:  PACU - hemodynamically stable.   Delay start of Pharmacological VTE agent (>24hrs) due to surgical blood loss or risk of bleeding: yes

## 2013-11-06 NOTE — Anesthesia Preprocedure Evaluation (Addendum)
Anesthesia Evaluation  Patient identified by MRN, date of birth, ID band Patient awake    Reviewed: Allergy & Precautions, H&P , NPO status , Patient's Chart, lab work & pertinent test results  Airway Mallampati: II TM Distance: >3 FB Neck ROM: Full    Dental  (+) Dental Advisory Given, Teeth Intact   Pulmonary former smoker,  breath sounds clear to auscultation        Cardiovascular negative cardio ROS  Rhythm:Regular Rate:Normal     Neuro/Psych    GI/Hepatic negative GI ROS, Neg liver ROS,   Endo/Other    Renal/GU negative Renal ROS     Musculoskeletal   Abdominal   Peds  Hematology   Anesthesia Other Findings   Reproductive/Obstetrics                         Anesthesia Physical Anesthesia Plan  ASA: II  Anesthesia Plan: General   Post-op Pain Management:    Induction: Intravenous  Airway Management Planned: Oral ETT  Additional Equipment:   Intra-op Plan:   Post-operative Plan: Extubation in OR  Informed Consent: I have reviewed the patients History and Physical, chart, labs and discussed the procedure including the risks, benefits and alternatives for the proposed anesthesia with the patient or authorized representative who has indicated his/her understanding and acceptance.   Dental advisory given  Plan Discussed with: CRNA, Anesthesiologist and Surgeon  Anesthesia Plan Comments:         Anesthesia Quick Evaluation

## 2013-11-06 NOTE — Interval H&P Note (Signed)
History and Physical Interval Note:  11/06/2013 2:15 PM  Jordan Braun  has presented today for surgery, with the diagnosis of Right L5-S1 HNP  The various methods of treatment have been discussed with the patient and family. After consideration of risks, benefits and other options for treatment, the patient has consented to  Procedure(s) with comments: MICRODISCECTOMY LUMBAR LAMINECTOMY (Right) - Right L5-S1 Microdiscectomy as a surgical intervention .  The patient's history has been reviewed, patient examined, no change in status, stable for surgery.  I have reviewed the patient's chart and labs.  Questions were answered to the patient's satisfaction.     [name]

## 2013-11-06 NOTE — Discharge Instructions (Signed)
    No lifting greater than 10 lbs. Avoid bending, stooping and twisting. Walk in house for first week them may start to get out slowly increasing distance up to one mile by 3 weeks post op. Keep incision dry for 3 days, may use tegaderm or similar water impervious dressing.  

## 2013-11-06 NOTE — Anesthesia Postprocedure Evaluation (Signed)
  Anesthesia Post-op Note  Patient: Jordan Braun  Procedure(s) Performed: Procedure(s) with comments: MICRODISCECTOMY LUMBAR LAMINECTOMY (Right) - Right L5-S1 Microdiscectomy  Patient Location: PACU  Anesthesia Type:General  Level of Consciousness: awake  Airway and Oxygen Therapy: Patient Spontanous Breathing  Post-op Pain: mild  Post-op Assessment: Post-op Vital signs reviewed  Post-op Vital Signs: Reviewed  Last Vitals:  Filed Vitals:   11/06/13 1320  BP: 135/76  Pulse: 82  Temp: 36.3 C  Resp: 20    Complications: No apparent anesthesia complications

## 2013-11-06 NOTE — Anesthesia Procedure Notes (Signed)
Procedure Name: Intubation Date/Time: 11/06/2013 2:46 PM Performed by: Vita Barley E Pre-anesthesia Checklist: Patient identified, Emergency Drugs available, Suction available and Patient being monitored Patient Re-evaluated:Patient Re-evaluated prior to inductionOxygen Delivery Method: Circle system utilized Preoxygenation: Pre-oxygenation with 100% oxygen Intubation Type: IV induction Ventilation: Mask ventilation without difficulty Laryngoscope Size: Miller and 2 Grade View: Grade I Tube type: Oral Tube size: 7.5 mm Number of attempts: 1 Airway Equipment and Method: Stylet Placement Confirmation: ETT inserted through vocal cords under direct vision,  positive ETCO2 and breath sounds checked- equal and bilateral Secured at: 23 cm Tube secured with: Tape Dental Injury: Teeth and Oropharynx as per pre-operative assessment

## 2013-11-06 NOTE — Transfer of Care (Signed)
Immediate Anesthesia Transfer of Care Note  Patient: Jordan Braun  Procedure(s) Performed: Procedure(s) with comments: MICRODISCECTOMY LUMBAR LAMINECTOMY (Right) - Right L5-S1 Microdiscectomy  Patient Location: PACU  Anesthesia Type:General  Level of Consciousness: awake and alert   Airway & Oxygen Therapy: Patient Spontanous Breathing and Patient connected to nasal cannula oxygen  Post-op Assessment: Report given to PACU RN, Post -op Vital signs reviewed and stable and Patient moving all extremities X 4  Post vital signs: Reviewed and stable  Complications: No apparent anesthesia complications

## 2013-11-07 ENCOUNTER — Encounter (HOSPITAL_COMMUNITY): Payer: Self-pay | Admitting: Orthopaedic Surgery

## 2013-11-07 DIAGNOSIS — M5126 Other intervertebral disc displacement, lumbar region: Secondary | ICD-10-CM | POA: Diagnosis not present

## 2013-11-07 NOTE — Progress Notes (Signed)
D/c to home with wife via wheelchair. Pain meds given and VSS. D/c instructions givven and understood.  Dressing changed prior to d/c

## 2013-11-07 NOTE — Op Note (Signed)
NAMELUCAN, Jordan Braun NO.:  [id number]  MEDICAL RECORD NO.:  [id number]  LOCATION:  [id number]                        FACILITY:  [facility]  PHYSICIAN:  Delbert Vu C. Ophelia Charter, M.D.    DATE OF BIRTH:  1970/08/24  DATE OF PROCEDURE:  11/06/2013 DATE OF DISCHARGE:                              OPERATIVE REPORT   PREOPERATIVE DIAGNOSIS:  Right L5-S1 herniated nucleus pulposus with radiculopathy.  POSTOPERATIVE DIAGNOSIS:  Right L5-S1 herniated nucleus pulposus with radiculopathy.  PROCEDURE:  Right L5-S1 microdiskectomy.  SURGEON:  Gregorey Nabor C. Ophelia Charter, M.D.  ASSISTANT:  Wende Neighbors, PA-C, medically necessary and present for the entire procedure.  ANESTHESIA:  GOT plus Marcaine local.  COMPLICATIONS:  None.  FINDINGS:  Large extruded HNP with compression.  DESCRIPTION OF PROCEDURE:  After induction of general anesthesia, orotracheal intubation, the patient was placed prone, on chest rolls, careful padding and positioning, DuraPrep.  The area was squared with towels, Betadine, Steri-Drape applied and time-out procedure completed. Ancef was given prophylactically.  Incision was made in this thin patient at L5-S1 subperiosteal dissection out to the facet joint and Penfield 4 placed.  The interlaminar space L5-S1 confirmed as appropriate level with cross-table lateral x-ray.  Thick chunks of ligament were removed.  Laminotomy was enlarged.  The interlaminar space was actually relatively small for L5-S1.  There was some mild overhanging of facet in this 43 year old male.  Nerve root was extensively scarred down to the disk and the disk was entered at the attachment to the endplate of L5 on the up sweep which was the only area that was not adherent.  Once this was opened up with 15 blade, chunks of disk were teased out.  The anulus opening was enlarged and then multiple large chunks of disk were removed then followed by free fragment removal that was 2 x 1 cm.  Nerve root was  settling much freer.  Chunks of disks were peeled off the undersurface of the nerve root towards the midline, up down straight micro pituitary, sub down straight straight micro pituitary.  Epstein curette was used pushing fragments down from midline into the disk space and then removing with  pituitary.  Nerve root was decompressed.  Some of the disk material was present laterally out just over the top of the pedicle down into the disk space.  After irrigation, dura was free, nerve root was free, palpation out over the shoulder of the nerve root around the pedicle showed no areas compression.  Hockey stick could be placed anterior to the dura with no areas of compression.  Irrigation with saline solution and standard layered closure with #1 Vicryl in deep layer, 2-0 on the subcutaneous tissue, subcuticular closure.  Dermabond, postop dressing and transferred in supine position, extubated, into the recovery room.  Instrument count and needle count was correct.     Tarvares Lant C. Ophelia Charter, M.D.     MCY/MEDQ  D:  11/06/2013  T:  11/07/2013  Job:  [id number]

## 2013-11-07 NOTE — Progress Notes (Signed)
Subjective: 1 Day Post-Op Procedure(s) (LRB): MICRODISCECTOMY LUMBAR LAMINECTOMY (Right) Patient reports pain as mild.    Objective: Vital signs in last 24 hours: Temp:  [97.3 F (36.3 C)-97.9 F (36.6 C)] 97.6 F (36.4 C) (09/03 0624) Pulse Rate:  [57-82] 60 (09/03 0624) Resp:  [10-20] 16 (09/03 0624) BP: (98-135)/(52-76) 107/52 mmHg (09/03 0624) SpO2:  [94 %-100 %] 95 % (09/03 0624) Weight:  [81.647 kg (180 lb)] 81.647 kg (180 lb) (09/02 1320)  Intake/Output from previous day: 09/02 0701 - 09/03 0700 In: 1903 [I.V.:1903] Out: 950 [Urine:900; Blood:50] Intake/Output this shift:     Recent Labs  11/04/13 0904  HGB 14.9    Recent Labs  11/04/13 0904  WBC 5.5  RBC 5.03  HCT 44.1  PLT 229    Recent Labs  11/04/13 0904  NA 141  K 4.4  CL 105  CO2 27  BUN 11  CREATININE 0.98  GLUCOSE 97  CALCIUM 9.0    Recent Labs  11/04/13 0904  INR 0.96    Neurologically intact  Assessment/Plan: 1 Day Post-Op Procedure(s) (LRB): MICRODISCECTOMY LUMBAR LAMINECTOMY (Right) Plan  Discharge home.  No leg pain.   Letishia Elliott C 11/07/2013, 7:36 AM

## 2013-11-18 NOTE — Discharge Summary (Signed)
Physician Discharge Summary  Patient ID: Jordan Braun MRN: [id number] DOB/AGE: 07/14/1970 43 y.o.  Admit date: 11/06/2013 Discharge date: 11/07/2013  Admission Diagnoses:  HNP (herniated nucleus pulposus), lumbar right L5-S1  Discharge Diagnoses:  Principal Problem:   HNP (herniated nucleus pulposus), lumbar   Past Medical History  Diagnosis Date  . Medical history non-contributory     Surgeries: Procedure(s): MICRODISCECTOMY LUMBAR LAMINECTOMY RIGHT L5-S1 on 11/06/2013   Consultants (if any):  NONE   Discharged Condition: Improved  Hospital Course: Jordan Braun is an 43 y.o. male who was admitted 11/06/2013 with a diagnosis of HNP (herniated nucleus pulposus), lumbar and went to the operating room on 11/06/2013 and underwent the above named procedures.    He was given perioperative antibiotics:      Anti-infectives   Start     Dose/Rate Route Frequency Ordered Stop   11/06/13 2200  ceFAZolin (ANCEF) IVPB 1 g/50 mL premix     1 g 100 mL/hr over 30 Minutes Intravenous Every 8 hours 11/06/13 1738 11/07/13 0554   11/06/13 0600  ceFAZolin (ANCEF) IVPB 2 g/50 mL premix     2 g 100 mL/hr over 30 Minutes Intravenous On call to O.R. 11/05/13 1407 11/06/13 1453    .  He was given sequential compression devices, early ambulation for DVT prophylaxis.  He benefited maximally from the hospital stay and there were no complications.    Recent vital signs:  Filed Vitals:   11/07/13 0624  BP: 107/52  Pulse: 60  Temp: 97.6 F (36.4 C)  Resp: 16    Recent laboratory studies:  Lab Results  Component Value Date   HGB 14.9 11/04/2013   Lab Results  Component Value Date   WBC 5.5 11/04/2013   PLT 229 11/04/2013   Lab Results  Component Value Date   INR 0.96 11/04/2013   Lab Results  Component Value Date   NA 141 11/04/2013   K 4.4 11/04/2013   CL 105 11/04/2013   CO2 27 11/04/2013   BUN 11 11/04/2013   CREATININE 0.98 11/04/2013   GLUCOSE 97 11/04/2013    Discharge  Medications:     Medication List         methocarbamol 500 MG tablet  Commonly known as:  ROBAXIN  Take 1 tablet (500 mg total) by mouth every 6 (six) hours as needed for muscle spasms (spasm).     naproxen sodium 220 MG tablet  Commonly known as:  ANAPROX  Take 440 mg by mouth daily as needed (for pain).     oxyCODONE-acetaminophen 5-325 MG per tablet  Commonly known as:  ROXICET  Take 1-2 tablets by mouth every 4 (four) hours as needed.        Diagnostic Studies: Dg Chest 2 View  10/24/2013   CLINICAL DATA:  Preop.  EXAM: CHEST  2 VIEW  COMPARISON:  None.  FINDINGS: The heart size and mediastinal contours are within normal limits. Both lungs are clear. No pneumothorax or pleural effusion is noted. The visualized skeletal structures are unremarkable.  IMPRESSION: No acute cardiopulmonary abnormality seen.   Electronically Signed   By: Roque Lias M.D.   On: 10/24/2013 16:01   Dg Lumbar Spine 1 View  11/06/2013   CLINICAL DATA:  Lumbar spine surgery.  EXAM: LUMBAR SPINE - 1 VIEW  COMPARISON:  Lumbar MRI 10/02/2003.  FINDINGS: Lumbar vertebra are numbered with the lowest segmented appearing lumbar vertebra on lateral view as L5. Metallic surgical instrument noted at the L5-S1 disc space.  IMPRESSION: Metallic surgical instrument noted at the L5-S1 disc space.   Electronically Signed   By: Maisie Fus  Register   On: 11/06/2013 16:21    Disposition: 01-Home or Self Care  DISCHARGE INSTRUCTIONS: No lifting greater than 10 lbs. Avoid bending, stooping and twisting. Walk in house for first week them may start to get out slowly increasing distance up to one mile by 3 weeks post op. Keep incision dry for 3 days, may use tegaderm or similar water impervious dressing.    Follow-up Information   Schedule an appointment as soon as possible for a visit with Eldred Manges, MD.   Specialty:  Orthopedic Surgery   Contact information:   [address] [place] [address] [phone]         Signed: Wende Neighbors 11/18/2013, 11:46 AM

## 2015-08-19 IMAGING — CR DG CHEST 2V
2 series · 2 of 2 positions shown · non-contrast
Comparison: None.

CLINICAL DATA: Preop.

EXAM:
CHEST  2 VIEW

[w chest pa]
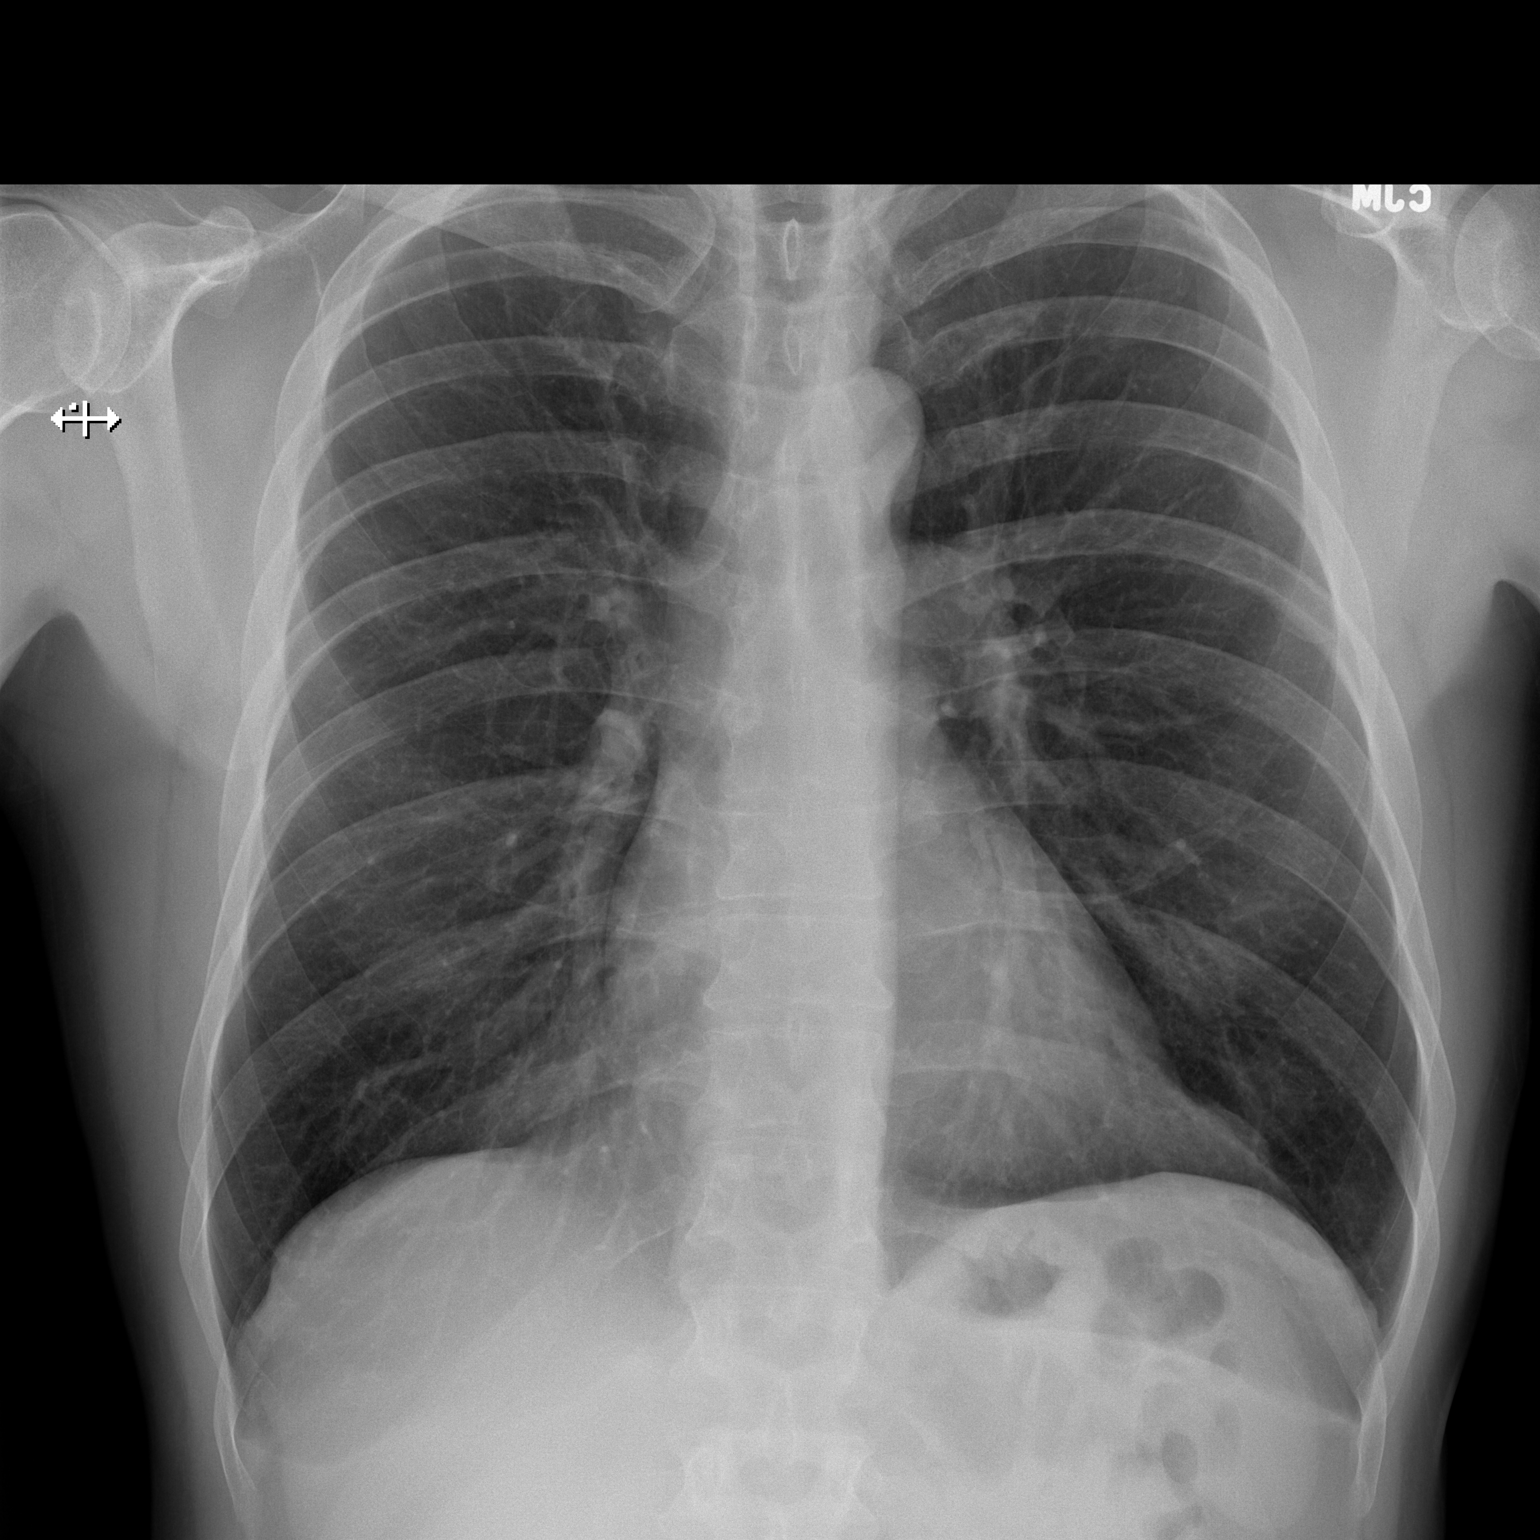

[w chest lat]
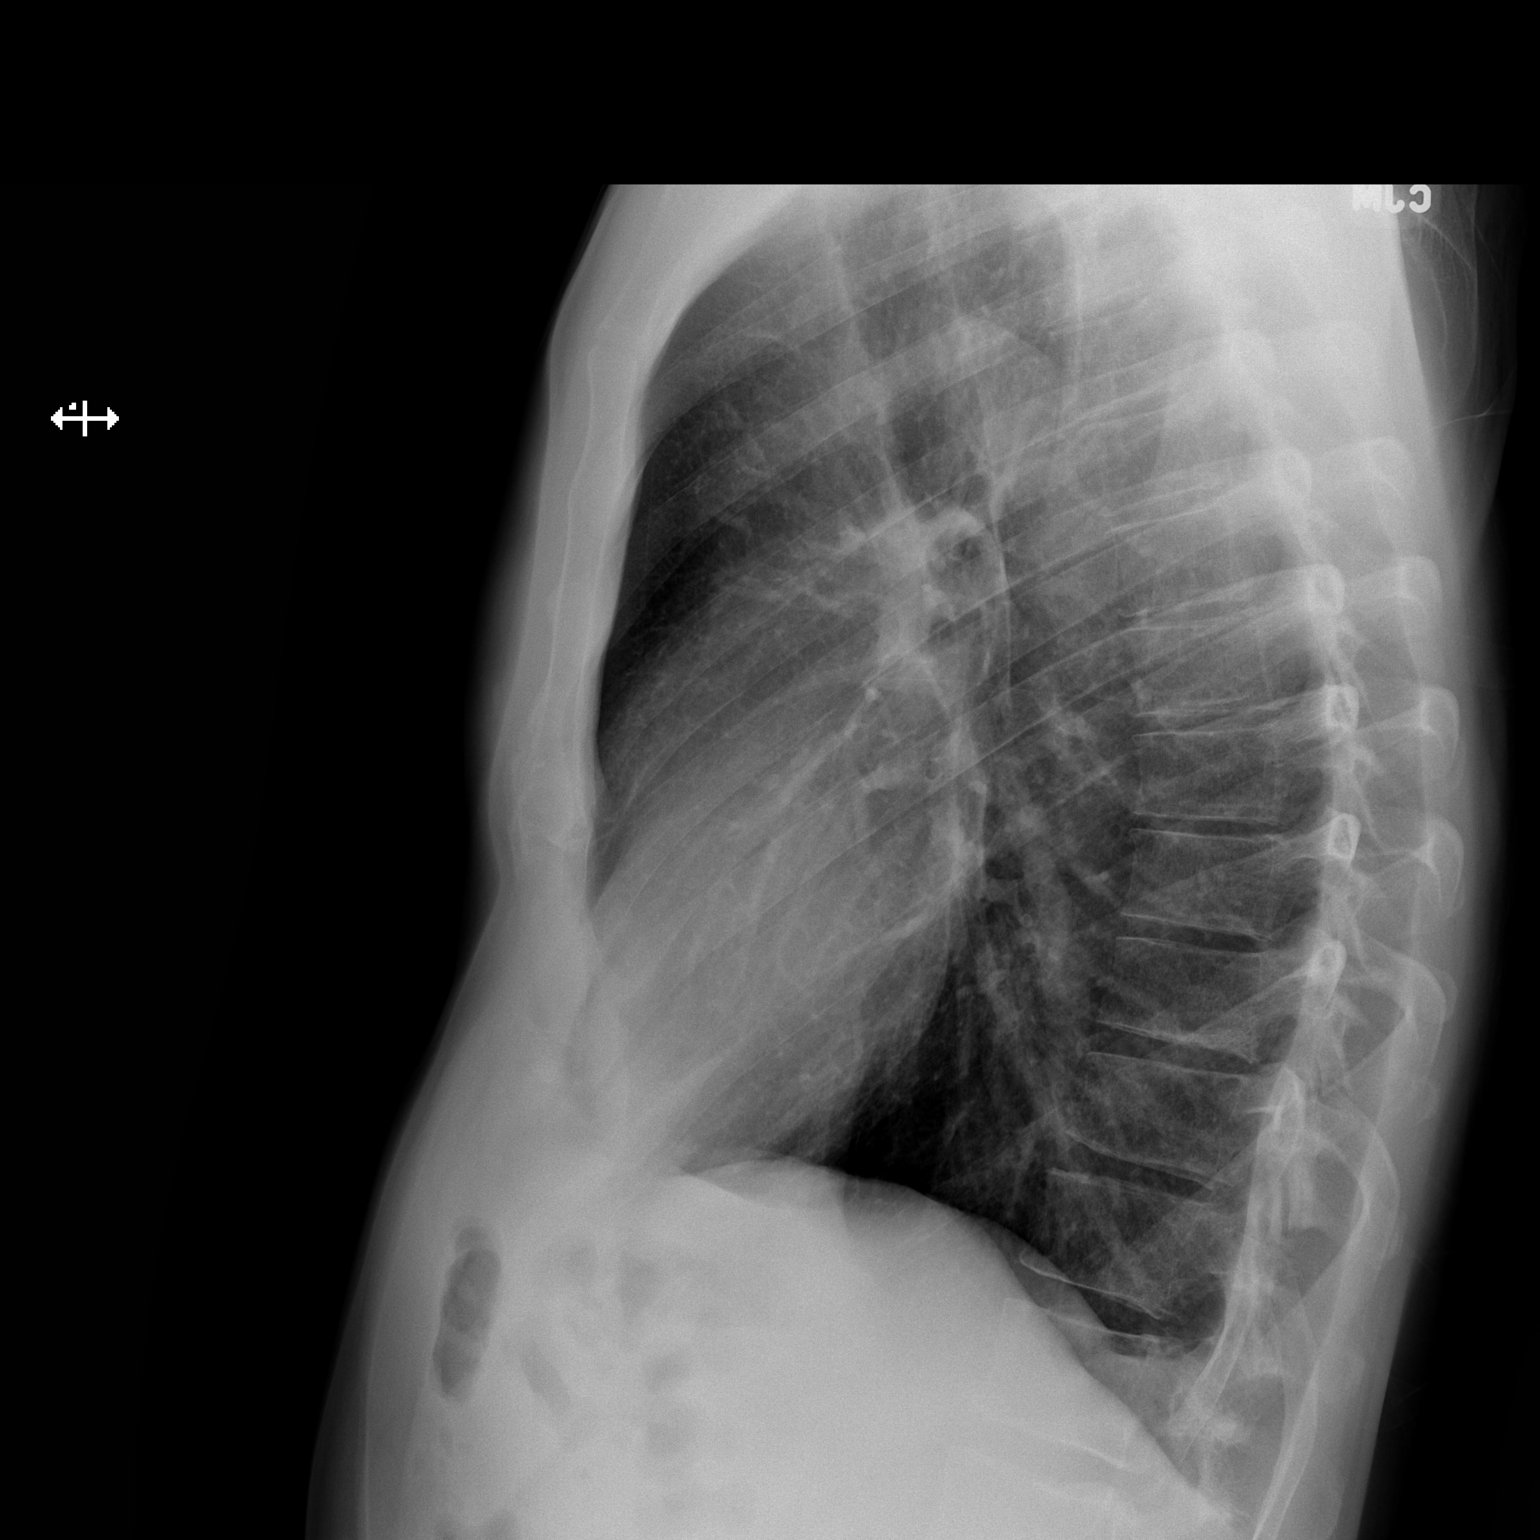

[2 of 2 positions shown; findings below may reference images not displayed]

FINDINGS: The heart size and mediastinal contours are within normal limits.
Both lungs are clear. No pneumothorax or pleural effusion is noted.
The visualized skeletal structures are unremarkable.
IMPRESSION: No acute cardiopulmonary abnormality seen.

## 2015-09-01 IMAGING — CR DG LUMBAR SPINE 1V
1 series · 1 of 1 positions shown · non-contrast
Comparison: Lumbar MRI 10/02/2003.

CLINICAL DATA: Lumbar spine surgery.

EXAM:
LUMBAR SPINE - 1 VIEW

[lateral]
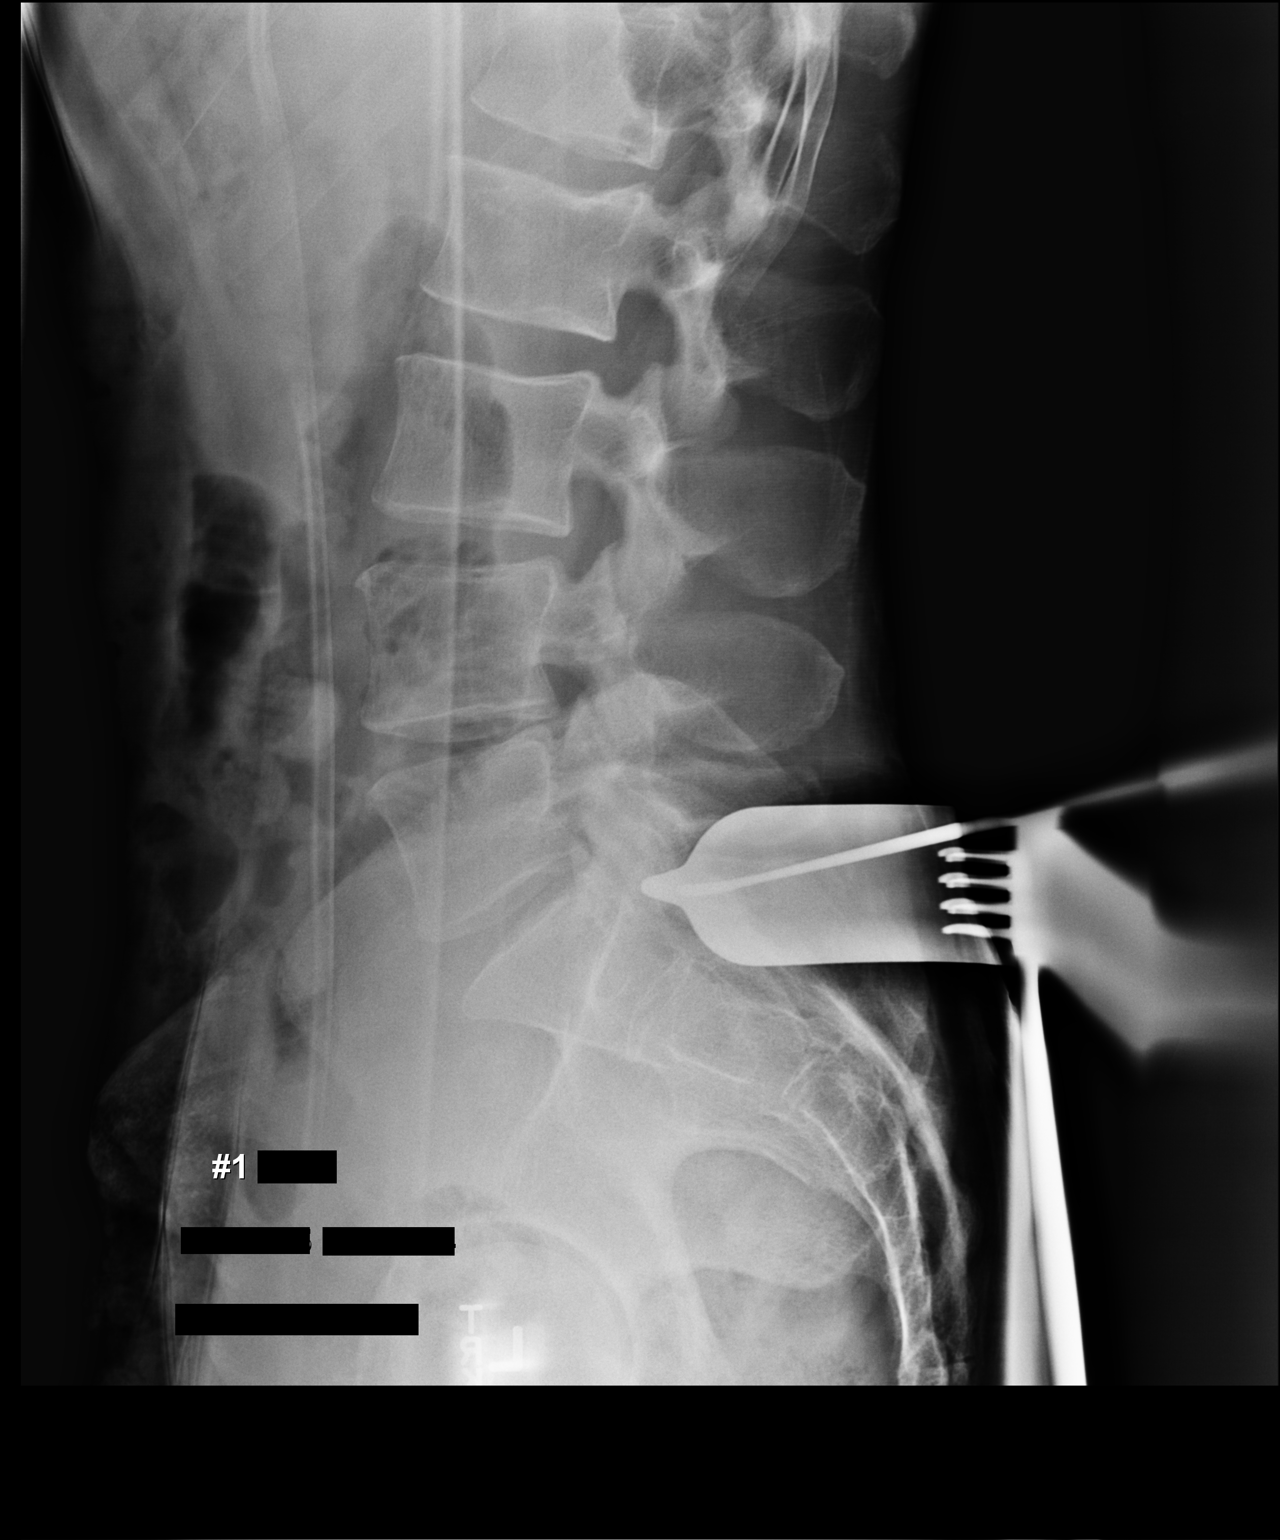

[1 of 1 positions shown; findings below may reference images not displayed]

FINDINGS: Lumbar vertebra are numbered with the lowest segmented appearing
lumbar vertebra on lateral view as L5. Metallic surgical instrument
noted at the L5-S1 disc space.
IMPRESSION: Metallic surgical instrument noted at the L5-S1 disc space.

## 2020-03-23 ENCOUNTER — Ambulatory Visit: Payer: Self-pay | Admitting: Family Medicine

## 2020-04-01 ENCOUNTER — Other Ambulatory Visit: Payer: Self-pay

## 2020-04-01 ENCOUNTER — Encounter: Payer: Self-pay | Admitting: Family Medicine

## 2020-04-01 ENCOUNTER — Ambulatory Visit: Payer: BC Managed Care – PPO | Admitting: Family Medicine

## 2020-04-01 DIAGNOSIS — G4700 Insomnia, unspecified: Secondary | ICD-10-CM | POA: Diagnosis not present

## 2020-04-01 DIAGNOSIS — F419 Anxiety disorder, unspecified: Secondary | ICD-10-CM | POA: Diagnosis not present

## 2020-04-01 NOTE — Assessment & Plan Note (Signed)
Long discussion with patient about anxiety/insomnia. He notes he is bothered 1-2 times per week and no panic attacks. Discussed trial of non-medication approaches and/or Ashwagandha trial. F/u in 2 months if not improved

## 2020-04-01 NOTE — Progress Notes (Signed)
Subjective:     Jordan Braun is a 50 y.o. male presenting for Establish Care and Anxiety (Attributes this to stress with multiple jobs )     HPI  #anxiety/stress - comes and goes - a lot of responsibility at work  - feels like he brings it on himself - has a lot of worrying  - denies panic symptoms - no heart racing, sob - worrying about most things - occurs at least 2 times a week - the other days of the week he feels fine - has some days where he does not want to talk to anyone  - no depression symptoms - fulltime job and parttime job (3 nights a week until 11 pm) - gets 4-5 hours of sleep on the days that he works late - gets 6-7 hours on the other night - trouble falling asleep - 1 hour - will also occasionally wake up in the middle of the night as well and be awake for 1 hour - treatment: tried melatonin   Review of Systems   Social History   Tobacco Use  Smoking Status Former Smoker  . Packs/day: 0.50  . Years: 10.00  . Pack years: 5.00  . Types: Cigarettes  . Quit date: 10/24/2008  . Years since quitting: 11.4  Smokeless Tobacco Former Neurosurgeon  . Types: Chew        Objective:    BP Readings from Last 3 Encounters:  04/01/20 110/70  11/07/13 (!) 107/52  11/04/13 121/71   Wt Readings from Last 3 Encounters:  04/01/20 172 lb 8 oz (78.2 kg)  11/06/13 180 lb (81.6 kg)  11/04/13 180 lb 1.6 oz (81.7 kg)    BP 110/70   Pulse 78   Temp 98.3 F (36.8 C) (Temporal)   Ht 5' 10.25" (1.784 m)   Wt 172 lb 8 oz (78.2 kg)   SpO2 96%   BMI 24.58 kg/m    Physical Exam Constitutional:      Appearance: Normal appearance. He is not ill-appearing or diaphoretic.  HENT:     Right Ear: External ear normal.     Left Ear: External ear normal.  Eyes:     General: No scleral icterus.    Extraocular Movements: Extraocular movements intact.     Conjunctiva/sclera: Conjunctivae normal.  Cardiovascular:     Rate and Rhythm: Normal rate and regular rhythm.      Heart sounds: No murmur heard.   Pulmonary:     Effort: Pulmonary effort is normal. No respiratory distress.     Breath sounds: Normal breath sounds. No wheezing.  Musculoskeletal:     Cervical back: Neck supple.  Skin:    General: Skin is warm and dry.  Neurological:     Mental Status: He is alert. Mental status is at baseline.  Psychiatric:        Mood and Affect: Mood normal.     Depression screen [facility] 2/9 04/01/2020  Decreased Interest 2  Down, Depressed, Hopeless 0  PHQ - 2 Score 2  Altered sleeping 3  Tired, decreased energy 2  Change in appetite 1  Feeling bad or failure about yourself  0  Trouble concentrating 0  Moving slowly or fidgety/restless 0  Suicidal thoughts 0  PHQ-9 Score 8  Difficult doing work/chores Somewhat difficult   GAD 7 : Generalized Anxiety Score 04/01/2020  Nervous, Anxious, on Edge 2  Control/stop worrying 1  Worry too much - different things 2  Trouble relaxing 2  Restless  0  Easily annoyed or irritable 2  Afraid - awful might happen 2  Total GAD 7 Score 11  Anxiety Difficulty Somewhat difficult         Assessment & Plan:   Problem List Items Addressed This Visit      Other   Anxiety    Long discussion with patient about anxiety/insomnia. He notes he is bothered 1-2 times per week and no panic attacks. Discussed trial of non-medication approaches and/or Ashwagandha trial. F/u in 2 months if not improved      Insomnia    Likely related to shift work - due to late nights 3 times a week and early AMs. Trial of melatonin as effective in the past.           Return if symptoms worsen or fail to improve, for annual physical.  Lynnda Child, MD  This visit occurred during the SARS-CoV-2 public health emergency.  Safety protocols were in place, including screening questions prior to the visit, additional usage of staff PPE, and extensive cleaning of exam room while observing appropriate contact time as indicated for disinfecting  solutions.

## 2020-04-01 NOTE — Patient Instructions (Addendum)
Melatonin 3-5 mg, ideally take 30-60 minutes before planned bedtime  Ashwagandha - to try for stress - Studied dose 300 mg twice daily  How to help anxiety - without medication.   1) Regular Exercise - walking, jogging, cycling, dancing, strength training --> Yoga has been shown in research to reduce depression and anxiety -- with even just one hour long session per week  2)  Begin a Mindfulness/Meditation practice -- this can take a little as 3 minutes and is helpful for all kinds of mood issues -- You can find resources in books -- Or you can download apps like  ---- Headspace App (which currently has free content called "Weathering the Storm") ---- Calm (which has a few free options)  ---- Insignt Timer ---- Stop, Breathe & Think  # With each of these Apps - you should decline the "start free trial" offer and as you search through the App should be able to access some of their free content. You can also chose to pay for the content if you find one that works well for you.   # Many of them also offer sleep specific content which may help with insomnia  3) Healthy Diet -- Avoid or decrease Caffeine -- Avoid or decrease Alcohol -- Drink plenty of water, have a balanced diet -- Avoid cigarettes and marijuana (as well as other recreational drugs)  4) Consider contacting a professional therapist  -- [facility] is one option. Call [phone] -- Or you can check out www.psychologytoday.com -- you can read bios of therapists and see if they accept insurance -- Check with your insurance to see if you have coverage and who may take your insurance

## 2020-04-01 NOTE — Assessment & Plan Note (Signed)
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 Trial of melatonin as effective in the past.

## 2020-05-19 ENCOUNTER — Encounter: Payer: BC Managed Care – PPO | Admitting: Family Medicine

## 2020-05-19 DIAGNOSIS — Z0289 Encounter for other administrative examinations: Secondary | ICD-10-CM

## 2020-06-09 ENCOUNTER — Ambulatory Visit: Payer: BC Managed Care – PPO | Admitting: Family Medicine

## 2020-06-09 ENCOUNTER — Other Ambulatory Visit: Payer: Self-pay

## 2020-06-09 VITALS — BP 132/70 | HR 67 | Temp 97.8°F | Ht 70.0 in | Wt 173.0 lb

## 2020-06-09 DIAGNOSIS — Z136 Encounter for screening for cardiovascular disorders: Secondary | ICD-10-CM | POA: Diagnosis not present

## 2020-06-09 DIAGNOSIS — Z1159 Encounter for screening for other viral diseases: Secondary | ICD-10-CM

## 2020-06-09 DIAGNOSIS — Z0000 Encounter for general adult medical examination without abnormal findings: Secondary | ICD-10-CM

## 2020-06-09 DIAGNOSIS — F419 Anxiety disorder, unspecified: Secondary | ICD-10-CM

## 2020-06-09 DIAGNOSIS — Z131 Encounter for screening for diabetes mellitus: Secondary | ICD-10-CM | POA: Diagnosis not present

## 2020-06-09 DIAGNOSIS — G4700 Insomnia, unspecified: Secondary | ICD-10-CM | POA: Diagnosis not present

## 2020-06-09 DIAGNOSIS — Z114 Encounter for screening for human immunodeficiency virus [HIV]: Secondary | ICD-10-CM

## 2020-06-09 LAB — LIPID PANEL
Cholesterol: 124 mg/dL (ref 0–200)
HDL: 49.7 mg/dL (ref >39.00)
LDL Cholesterol: 63 mg/dL (ref 0–99)
NonHDL: 74.75
Total CHOL/HDL Ratio: 3
Triglycerides: 61 mg/dL (ref 0.0–149.0)
VLDL: 12.2 mg/dL (ref 0.0–40.0)

## 2020-06-09 LAB — COMPREHENSIVE METABOLIC PANEL
ALT: 18 U/L (ref 0–53)
AST: 18 U/L (ref 0–37)
Albumin: 4.2 g/dL (ref 3.5–5.2)
Alkaline Phosphatase: 63 U/L (ref 39–117)
BUN: 12 mg/dL (ref 6–23)
CO2: 29 mEq/L (ref 19–32)
Calcium: 9.2 mg/dL (ref 8.4–10.5)
Chloride: 104 mEq/L (ref 96–112)
Creatinine, Ser: 0.81 mg/dL (ref 0.40–1.50)
GFR: 103.43 mL/min (ref >60.00)
Glucose, Bld: 80 mg/dL (ref 70–99)
Potassium: 4.2 mEq/L (ref 3.5–5.1)
Sodium: 139 mEq/L (ref 135–145)
Total Bilirubin: 0.5 mg/dL (ref 0.2–1.2)
Total Protein: 6.9 g/dL (ref 6.0–8.3)

## 2020-06-09 NOTE — Assessment & Plan Note (Signed)
Improved. No need for melatonin

## 2020-06-09 NOTE — Assessment & Plan Note (Signed)
Improved continue ashwagandha daily. Mychart if wanting to try hydroxyzine if still having occasional symptoms.

## 2020-06-09 NOTE — Patient Instructions (Addendum)
Go to the dentist  Try taking the aswagandha daily  mychart message if still worsening anxiety and want try hydroxyzine - may make you sleepy

## 2020-06-09 NOTE — Progress Notes (Signed)
Subjective:     Jordan Braun is a 50 y.o. male presenting for Follow-up (Stress and anxiety)     HPI  #Stress/anxiety - things are improving - started taking aswaghandha is taking 600 mg daily - walking and running on the weekends - no time to exercising   #Insomnia - this has improved - did not do melatonin - does not feel he needs medication - is falling asleep easily   Review of Systems  Constitutional: Negative for chills and fever.  HENT: Negative for congestion and sore throat.   Respiratory: Negative for shortness of breath.   Cardiovascular: Negative for chest pain.  Gastrointestinal: Negative for nausea and vomiting.  Genitourinary: Negative.   Musculoskeletal: Negative.  Negative for myalgias.  Skin: Negative for rash.  Neurological: Negative for dizziness and headaches.  Hematological: Does not bruise/bleed easily.  Psychiatric/Behavioral: The patient is nervous/anxious.     04/01/20: Clinic - insomnia/anxiety - job related - non-medicatin and melatonin  Social History   Tobacco Use  Smoking Status Former Smoker  . Packs/day: 0.50  . Years: 10.00  . Pack years: 5.00  . Types: Cigarettes  . Quit date: 10/24/2008  . Years since quitting: 11.6  Smokeless Tobacco Former Neurosurgeon  . Types: Chew        Objective:    BP Readings from Last 3 Encounters:  06/09/20 132/70  04/01/20 110/70  11/07/13 (!) 107/52   Wt Readings from Last 3 Encounters:  06/09/20 173 lb (78.5 kg)  04/01/20 172 lb 8 oz (78.2 kg)  11/06/13 180 lb (81.6 kg)    BP 132/70   Pulse 67   Temp 97.8 F (36.6 C) (Temporal)   Ht 5\' 10"  (1.778 m)   Wt 173 lb (78.5 kg)   SpO2 97%   BMI 24.82 kg/m    Physical Exam Constitutional:      General: He is not in acute distress.    Appearance: He is well-developed. He is not diaphoretic.  HENT:     Head: Normocephalic and atraumatic.     Right Ear: Tympanic membrane and ear canal normal.     Left Ear: Tympanic membrane and ear  canal normal.     Nose: Nose normal.     Mouth/Throat:     Pharynx: Uvula midline.  Eyes:     General: No scleral icterus.    Conjunctiva/sclera: Conjunctivae normal.     Pupils: Pupils are equal, round, and reactive to light.  Cardiovascular:     Rate and Rhythm: Normal rate and regular rhythm.     Heart sounds: Normal heart sounds. No murmur heard.   Pulmonary:     Effort: Pulmonary effort is normal. No respiratory distress.     Breath sounds: Normal breath sounds. No wheezing.  Abdominal:     General: Bowel sounds are normal. There is no distension.     Palpations: Abdomen is soft. There is no mass.     Tenderness: There is no abdominal tenderness. There is no guarding.  Musculoskeletal:        General: Normal range of motion.     Cervical back: Normal range of motion and neck supple.  Lymphadenopathy:     Cervical: No cervical adenopathy.  Skin:    General: Skin is warm and dry.     Capillary Refill: Capillary refill takes less than 2 seconds.  Neurological:     Mental Status: He is alert and oriented to person, place, and time.  GAD 7 : Generalized Anxiety Score 06/09/2020 04/01/2020  Nervous, Anxious, on Edge 2 2  Control/stop worrying 1 1  Worry too much - different things 1 2  Trouble relaxing 1 2  Restless 0 0  Easily annoyed or irritable 2 2  Afraid - awful might happen 0 2  Total GAD 7 Score 7 11  Anxiety Difficulty Somewhat difficult Somewhat difficult         Assessment & Plan:   Problem List Items Addressed This Visit      Other   Anxiety    Improved continue ashwagandha daily. Mychart if wanting to try hydroxyzine if still having occasional symptoms.       Insomnia    Improved. No need for melatonin       Other Visit Diagnoses    Annual physical exam    -  Primary   Relevant Orders   Comprehensive metabolic panel   Lipid panel   Screening for diabetes mellitus       Relevant Orders   Comprehensive metabolic panel   Encounter for  special screening examination for cardiovascular disorder       Relevant Orders   Lipid panel   Need for hepatitis C screening test       Relevant Orders   Hepatitis C antibody   Encounter for screening for HIV       Relevant Orders   HIV Antibody (routine testing w rflx)     Discussed need for routine eye and dental care Discussed healthy diet and regular exercise importance  Will obtain records for recent colonoscopy  Immunizations up to date    Return in about 1 year (around 06/09/2021).  Lynnda Child, MD  This visit occurred during the SARS-CoV-2 public health emergency.  Safety protocols were in place, including screening questions prior to the visit, additional usage of staff PPE, and extensive cleaning of exam room while observing appropriate contact time as indicated for disinfecting solutions.

## 2020-06-10 LAB — HEPATITIS C ANTIBODY
Hepatitis C Ab: NONREACTIVE
SIGNAL TO CUT-OFF: 0

## 2020-06-10 LAB — HIV ANTIBODY (ROUTINE TESTING W REFLEX): HIV 1&2 Ab, 4th Generation: NONREACTIVE

## 2020-07-20 ENCOUNTER — Encounter: Payer: Self-pay | Admitting: Family Medicine

## 2020-07-30 ENCOUNTER — Other Ambulatory Visit: Payer: Self-pay

## 2020-07-30 ENCOUNTER — Ambulatory Visit: Payer: BC Managed Care – PPO | Admitting: Family Medicine

## 2020-07-30 ENCOUNTER — Encounter: Payer: Self-pay | Admitting: Family Medicine

## 2020-07-30 VITALS — BP 102/80 | HR 80 | Temp 98.6°F | Ht 70.0 in | Wt 177.5 lb

## 2020-07-30 DIAGNOSIS — L570 Actinic keratosis: Secondary | ICD-10-CM | POA: Diagnosis not present

## 2020-07-30 NOTE — Patient Instructions (Signed)
Actinic Keratosis - suspected precancerous lesion  Your skin will likely blister. This should heal. Can cover and use vaseline if needed or keep to air  Wear hats/sunblock when outside  Call if the lesion returns and can plan for biopsy or dermatology referral.   If redness, fever, chills, swelling call or return.

## 2020-07-30 NOTE — Progress Notes (Signed)
   Subjective:     Randell Marineau is a 50 y.o. male presenting for Skin Discoloration (And raised on R temple x 1 week )     HPI   #Skin changed - 1 week - initially was soft when it first showed up - now hard  -    Review of Systems   Social History   Tobacco Use  Smoking Status Former Smoker  . Packs/day: 0.50  . Years: 10.00  . Pack years: 5.00  . Types: Cigarettes  . Quit date: 10/24/2008  . Years since quitting: 11.7  Smokeless Tobacco Former Neurosurgeon  . Types: Chew        Objective:    BP Readings from Last 3 Encounters:  07/30/20 102/80  06/09/20 132/70  04/01/20 110/70   Wt Readings from Last 3 Encounters:  07/30/20 177 lb 8 oz (80.5 kg)  06/09/20 173 lb (78.5 kg)  04/01/20 172 lb 8 oz (78.2 kg)    BP 102/80   Pulse 80   Temp 98.6 F (37 C) (Temporal)   Ht 5\' 10"  (1.778 m)   Wt 177 lb 8 oz (80.5 kg)   SpO2 97%   BMI 25.47 kg/m    Physical Exam Constitutional:      Appearance: Normal appearance. He is not ill-appearing or diaphoretic.  HENT:     Right Ear: External ear normal.     Left Ear: External ear normal.  Eyes:     General: No scleral icterus.    Extraocular Movements: Extraocular movements intact.     Conjunctiva/sclera: Conjunctivae normal.  Cardiovascular:     Rate and Rhythm: Normal rate.  Pulmonary:     Effort: Pulmonary effort is normal.  Musculoskeletal:     Cervical back: Neck supple.  Skin:    General: Skin is warm and dry.     Comments: Raised hyperpigemented plaque lesion with erythematous base on the right temple.   Neurological:     Mental Status: He is alert. Mental status is at baseline.  Psychiatric:        Mood and Affect: Mood normal.           Assessment & Plan:   Problem List Items Addressed This Visit      Musculoskeletal and Integument   Actinic keratosis of forehead - Primary    Suspected actinic keratosis in sun exposed area. Discussed liquid nitrogen treatment and performed today.  Advised f/u her or with dermatology if lesion returns.         Procedure: Cryotherapy Indication: actinic keratosis - premalignant lesion  Verbal consent for procedure obtained from patient. Discussed risk for blister and that lesion may return.   Liquid nitrogen was applied for 10 seconds for 3 cycles to obtain complete freezing with waiting for complete thawing between cycles.   Pt tolerated procedure well. Return precautions including signs of infection discussed with patient.     Return if symptoms worsen or fail to improve.  , MD  This visit occurred during the SARS-CoV-2 public health emergency.  Safety protocols were in place, including screening questions prior to the visit, additional usage of staff PPE, and extensive cleaning of exam room while observing appropriate contact time as indicated for disinfecting solutions.

## 2020-07-30 NOTE — Assessment & Plan Note (Signed)
Suspected actinic keratosis in sun exposed area. Discussed liquid nitrogen treatment and performed today. Advised f/u her or with dermatology if lesion returns.

## 2020-12-21 ENCOUNTER — Encounter: Payer: Self-pay | Admitting: Family Medicine

## 2020-12-22 ENCOUNTER — Telehealth: Payer: Self-pay | Admitting: Family Medicine

## 2020-12-22 NOTE — Telephone Encounter (Signed)
Pt is scheduled for a f/u apt with dr. Selena Batten on 10.24.22

## 2020-12-22 NOTE — Telephone Encounter (Signed)
Called patient and left vm to call back.

## 2020-12-28 ENCOUNTER — Other Ambulatory Visit: Payer: Self-pay

## 2020-12-28 ENCOUNTER — Ambulatory Visit: Payer: BC Managed Care – PPO | Admitting: Family Medicine

## 2020-12-28 VITALS — BP 100/70 | HR 66 | Temp 97.3°F | Ht 70.0 in | Wt 186.2 lb

## 2020-12-28 DIAGNOSIS — Z23 Encounter for immunization: Secondary | ICD-10-CM

## 2020-12-28 DIAGNOSIS — F411 Generalized anxiety disorder: Secondary | ICD-10-CM

## 2020-12-28 DIAGNOSIS — Z8042 Family history of malignant neoplasm of prostate: Secondary | ICD-10-CM | POA: Diagnosis not present

## 2020-12-28 MED ORDER — SERTRALINE HCL 25 MG PO TABS: ORAL_TABLET | ORAL | 1 refills | Status: DC

## 2020-12-28 NOTE — Progress Notes (Signed)
Subjective:     Jordan Braun is a 50 y.o. male presenting for Follow-up (anxiety)     HPI  #Anxiety - stopped the aswaghanda completely - has been very irritated lately - here with wife - and getting irritated easily - was initially working but stopped helping - has not tried therapy  - is having symptoms daily and things come and go - has a full-time day job and parttime night job -   Review of Systems   Social History   Tobacco Use  Smoking Status Former   Packs/day: 0.50   Years: 10.00   Pack years: 5.00   Types: Cigarettes   Quit date: 10/24/2008   Years since quitting: 12.1  Smokeless Tobacco Former   Types: Chew        Objective:    BP Readings from Last 3 Encounters:  12/28/20 100/70  07/30/20 102/80  06/09/20 132/70   Wt Readings from Last 3 Encounters:  12/28/20 186 lb 4 oz (84.5 kg)  07/30/20 177 lb 8 oz (80.5 kg)  06/09/20 173 lb (78.5 kg)    BP 100/70   Pulse 66   Temp (!) 97.3 F (36.3 C) (Temporal)   Ht 5\' 10"  (1.778 m)   Wt 186 lb 4 oz (84.5 kg)   SpO2 97%   BMI 26.72 kg/m    Physical Exam Constitutional:      Appearance: Normal appearance. He is not ill-appearing or diaphoretic.  HENT:     Right Ear: External ear normal.     Left Ear: External ear normal.     Nose: Nose normal.  Eyes:     General: No scleral icterus.    Extraocular Movements: Extraocular movements intact.     Conjunctiva/sclera: Conjunctivae normal.  Cardiovascular:     Rate and Rhythm: Normal rate.  Pulmonary:     Effort: Pulmonary effort is normal.  Musculoskeletal:     Cervical back: Neck supple.  Skin:    General: Skin is warm and dry.  Neurological:     Mental Status: He is alert. Mental status is at baseline.  Psychiatric:        Mood and Affect: Mood normal.     GAD 7 : Generalized Anxiety Score 12/28/2020 06/09/2020 04/01/2020  Nervous, Anxious, on Edge 3 2 2   Control/stop worrying 2 1 1   Worry too much - different things 2 1 2    Trouble relaxing 3 1 2   Restless 1 0 0  Easily annoyed or irritable 3 2 2   Afraid - awful might happen 1 0 2  Total GAD 7 Score 15 7 11   Anxiety Difficulty Very difficult Somewhat difficult Somewhat difficult    Depression screen [facility] 2/9 12/28/2020 04/01/2020  Decreased Interest 2 2  Down, Depressed, Hopeless 1 0  PHQ - 2 Score 3 2  Altered sleeping 1 3  Tired, decreased energy 2 2  Change in appetite 2 1  Feeling bad or failure about yourself  0 0  Trouble concentrating 0 0  Moving slowly or fidgety/restless 0 0  Suicidal thoughts 0 0  PHQ-9 Score 8 8  Difficult doing work/chores Very difficult Somewhat difficult        Assessment & Plan:   Problem List Items Addressed This Visit       Other   Family history of prostate cancer    Discussed no need for prostate exam. Typically do not recommend screening until age 89 but can get with next lab draw at physical.  Generalized anxiety disorder - Primary    Worsening x >1 year. Discussed starting Zoloft 25 mg increase to 50 mg after 1 week. If side effects will switch to Lexapro. Hand out for therapy provided - though pt works 2 jobs so may be limited. Return 6 weeks.       Relevant Medications   sertraline (ZOLOFT) 25 MG tablet     Return in about 6 weeks (around 02/08/2021) for Anxiety follow-up with Tabitha or [employer] - Virtual Visit .  Lynnda Child, MD  This visit occurred during the SARS-CoV-2 public health emergency.  Safety protocols were in place, including screening questions prior to the visit, additional usage of staff PPE, and extensive cleaning of exam room while observing appropriate contact time as indicated for disinfecting solutions.

## 2020-12-28 NOTE — Assessment & Plan Note (Signed)
Discussed no need for prostate exam. Typically do not recommend screening until age 50 but can get with next lab draw at physical.

## 2020-12-28 NOTE — Assessment & Plan Note (Signed)
Worsening x >1 year. Discussed starting Zoloft 25 mg increase to 50 mg after 1 week. If side effects will switch to Lexapro. Hand out for therapy provided - though pt works 2 jobs so may be limited. Return 6 weeks.

## 2020-12-28 NOTE — Patient Instructions (Signed)
Can also look into things like [facility], app based therapy  [place] location:   [facility] [phone]-  -Counseling/therapy (14 years and up- [facility] only)   -Psychiatry services most insurances accepted-treat and evaluate/diagnose patients and prescribe medications.  -Medication Management 50 years old to 65 years.  -Diagnoses treated: Depression/anxiety, ADHD, Substance Abuse, Bipolar Disorder, Psychotherapy, Pain Management, Spiritual Care Services.  [facility] (83 years of age and older) [phone]- Psychology services/therapy only.  [employer] [phone]- All ages-Just [facility] services   [facility] [phone]- Employee Assistance Programs-counseling only.   [facility] [phone]- All ages-Psychology and Psychiatrist services (evaluate, treat, diagnose, prescribe medication) Treat all the diagnoses that would fall under Behavioral issues.   For new patients, on Monday, Wednesday and Friday from 8 am to 3 pm patient would just walk in and fill out paperwork and they would get patient enrolled in the services they need based on their answers.   [employer] [phone]- All ages. Monday through Friday-9am to 4 pm walk in times for patients to come in and be evaluated. Medication Management only at this time. No therapy available.   [place] location:   [facility] Address: [address] [place] [address] Phone: [phone] Counseling and psychiatry services.   [facility] and [place] locations)- [phone]- all ages, only therapy/counseling services/psychological evaluations. Services: aptitude testing, academic achievement testing, learning Disability evaluation, ADHD evaluations, psycho-educational evaluations, readiness for kindergarten Evaluations.   [facility] [phone] location  only has therapy services right now  [facility] [phone] services only. Works off [employer] in [employer].

## 2021-02-08 ENCOUNTER — Other Ambulatory Visit: Payer: Self-pay

## 2021-02-08 ENCOUNTER — Ambulatory Visit: Payer: BC Managed Care – PPO | Admitting: Nurse Practitioner

## 2021-02-08 VITALS — BP 122/60 | HR 66 | Temp 97.9°F | Resp 10 | Ht 70.0 in | Wt 181.4 lb

## 2021-02-08 DIAGNOSIS — F411 Generalized anxiety disorder: Secondary | ICD-10-CM

## 2021-02-08 MED ORDER — ESCITALOPRAM OXALATE 10 MG PO TABS: 10.0000 mg | ORAL_TABLET | Freq: Every day | ORAL | 2 refills | Status: DC

## 2021-02-08 NOTE — Progress Notes (Signed)
Established Patient Office Visit  Subjective:  Patient ID: Jordan Braun, male    DOB: [birth year]  Age: 50 y.o. MRN: [id number]  CC:  Chief Complaint  Patient presents with   Anxiety    Follow up. Zoloft has helped but feels tired on this medication    HPI Jordan Braun presents for Anxiety   States that he is on the 50mg  tablets and take it at night. States that he gets home later in the night and takes and he feels "sleepy" when he gets up. No other reported side effects per patient. He states that the medication is helping but he still gets annoyed at times over certain things.  PHQ9 SCORE ONLY 12/28/2020 04/01/2020  PHQ-9 Total Score 8 8   GAD 7 : Generalized Anxiety Score 12/28/2020 06/09/2020 04/01/2020  Nervous, Anxious, on Edge 3 2 2   Control/stop worrying 2 1 1   Worry too much - different things 2 1 2   Trouble relaxing 3 1 2   Restless 1 0 0  Easily annoyed or irritable 3 2 2   Afraid - awful might happen 1 0 2  Total GAD 7 Score 15 7 11   Anxiety Difficulty Very difficult Somewhat difficult Somewhat difficult      Past Medical History:  Diagnosis Date   Medical history non-contributory     Past Surgical History:  Procedure Laterality Date   HERNIA REPAIR     right inguinal hernia at age 60   LUMBAR LAMINECTOMY Right 11/06/2013   Procedure: MICRODISCECTOMY LUMBAR LAMINECTOMY;  Surgeon: , MD;  Location: [facility];  Service: Orthopedics;  Laterality: Right;  Right L5-S1 Microdiscectomy   WISDOM TOOTH EXTRACTION      Family History  Problem Relation Age of Onset   Epilepsy Mother    Prostate cancer Father    Heart attack Father 67   Arthritis Father     Social History   Socioeconomic History   Marital status: Married    Spouse name: Pam   Number of children: 2   Years of education: associates   Highest education level: Not on file  Occupational History   Not on file  Tobacco Use   Smoking status: Former    Packs/day: 0.50    Years:  10.00    Pack years: 5.00    Types: Cigarettes    Quit date: 10/24/2008    Years since quitting: 12.3   Smokeless tobacco: Former    Types: Use: Never used  Substance and Sexual Activity   Alcohol use: Yes    Comment: a few beers on the weekends   Drug use: No   Sexual activity: Yes    Birth control/protection: None  Other Topics Concern   Not on file  Social History Narrative   04/01/20   From: 01/06/2014 - moved for work in 1998   Living: with wife, Pam (1997) and children   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 [place]      Family: 2 children - Associate Professor (2003) and [place] (2006)      Enjoys: play sports, spend time with friends      Exercise: running and walking   Diet: no dairy, fruits/veggies, rice, limits red meat, mostly chicken and 04[birth year]04[birth year]1981      Safety   Seat belts: Yes    Guns: No   Safe in relationships: Yes    Social Determinants of Set designer Strain: Not on  file  Food Insecurity: Not on file  Transportation Needs: Not on file  Physical Activity: Not on file  Stress: Not on file  Social Connections: Not on file  Intimate Partner Violence: Not on file    Outpatient Medications Prior to Visit  Medication Sig Dispense Refill   sertraline (ZOLOFT) 25 MG tablet Take 25 mg daily for 1 week. Then increase to 50 mg (2 tablets). 60 tablet 1   No facility-administered medications prior to visit.    No Known Allergies  ROS Review of Systems  Constitutional:  Negative for chills and fever.  Respiratory:  Negative for cough and shortness of breath.   Cardiovascular:  Negative for chest pain.  Gastrointestinal:  Negative for abdominal pain, constipation, diarrhea, nausea and vomiting.  Neurological:  Negative for headaches.  Psychiatric/Behavioral:  Negative for hallucinations and suicidal ideas.      Objective:    Physical Exam Vitals and nursing note reviewed.  Constitutional:       Appearance: Normal appearance.  Cardiovascular:     Rate and Rhythm: Normal rate and regular rhythm.  Pulmonary:     Effort: Pulmonary effort is normal.     Breath sounds: Normal breath sounds.  Abdominal:     General: Bowel sounds are normal.  Skin:    General: Skin is warm.  Neurological:     Mental Status: He is alert.  Psychiatric:        Mood and Affect: Mood normal.        Behavior: Behavior normal.        Thought Content: Thought content normal.        Judgment: Judgment normal.    BP 122/60   Pulse 66   Temp 97.9 F (36.6 C)   Resp 10   Ht 5\' 10"  (1.778 m)   Wt 181 lb 6 oz (82.3 kg)   SpO2 97%   BMI 26.02 kg/m  Wt Readings from Last 3 Encounters:  02/08/21 181 lb 6 oz (82.3 kg)  12/28/20 186 lb 4 oz (84.5 kg)  07/30/20 177 lb 8 oz (80.5 kg)     Health Maintenance Due  Topic Date Due   Pneumococcal Vaccine 58-85 Years old (1 - PCV) Never done   TETANUS/TDAP  Never done   Zoster Vaccines- Shingrix (1 of 2) Never done   COLONOSCOPY (Pts 45-20yrs Insurance coverage will need to be confirmed)  Never done   COVID-19 Vaccine (4 - Booster for Pfizer series) 05/25/2020    There are no preventive care reminders to display for this patient.  No results found for: TSH Lab Results  Component Value Date   WBC 5.5 11/04/2013   HGB 14.9 11/04/2013   HCT 44.1 11/04/2013   MCV 87.7 11/04/2013   PLT 229 11/04/2013   Lab Results  Component Value Date   NA 139 06/09/2020   K 4.2 06/09/2020   CO2 29 06/09/2020   GLUCOSE 80 06/09/2020   BUN 12 06/09/2020   CREATININE 0.81 06/09/2020   BILITOT 0.5 06/09/2020   ALKPHOS 63 06/09/2020   AST 18 06/09/2020   ALT 18 06/09/2020   PROT 6.9 06/09/2020   ALBUMIN 4.2 06/09/2020   CALCIUM 9.2 06/09/2020   ANIONGAP 9 11/04/2013   GFR 103.43 06/09/2020   Lab Results  Component Value Date   CHOL 124 06/09/2020   Lab Results  Component Value Date   HDL 49.70 06/09/2020   Lab Results  Component Value Date   LDLCALC  63 06/09/2020  Lab Results  Component Value Date   TRIG 61.0 06/09/2020   Lab Results  Component Value Date   CHOLHDL 3 06/09/2020   No results found for: HGBA1C    Assessment & Plan:   Problem List Items Addressed This Visit       Other   Generalized anxiety disorder - Primary    Patient was seen approximately 6 weeks ago for generalized anxiety disorder was placed on sertraline 25 mg and titrated up to sertraline 50 mg.  States he is tolerating the medication well except for feeling sleepy when he wakes up in the morning and still having the metal from some sleepiness throughout the day.  Per last office note we will switch him from sertraline to Lexapro.  He will discontinue the 50 mg sertraline and start Lexapro 10 mg did discuss this with patient.  We will have him follow-up in about 8 weeks to see how he is doing.  Can send MyChart messages for follow-up sooner if need be      Relevant Medications   escitalopram (LEXAPRO) 10 MG tablet    No orders of the defined types were placed in this encounter.   Follow-up: Follow up 6-8 weeks for recheck of therapy. Sooner if needed  This visit occurred during the SARS-CoV-2 public health emergency.  Safety protocols were in place, including screening questions prior to the visit, additional usage of staff PPE, and extensive cleaning of exam room while observing appropriate contact time as indicated for disinfecting solutions.   Audria Nine, NP

## 2021-02-08 NOTE — Patient Instructions (Signed)
Nice to see you today We are going to stop the zoloft (setriline) and start the Lexapro ( escatilpram). You can my chart message me if needed in between Follow up 6-8 weeks

## 2021-02-08 NOTE — Assessment & Plan Note (Signed)
Patient was seen approximately 6 weeks ago for generalized anxiety disorder was placed on sertraline 25 mg and titrated up to sertraline 50 mg.  States he is tolerating the medication well except for feeling sleepy when he wakes up in the morning and still having the metal from some sleepiness throughout the day.  Per last office note we will switch him from sertraline to Lexapro.  He will discontinue the 50 mg sertraline and start Lexapro 10 mg did discuss this with patient.  We will have him follow-up in about 8 weeks to see how he is doing.  Can send MyChart messages for follow-up sooner if need be

## 2021-02-11 ENCOUNTER — Encounter: Payer: Self-pay | Admitting: Nurse Practitioner

## 2021-02-11 DIAGNOSIS — F411 Generalized anxiety disorder: Secondary | ICD-10-CM

## 2021-02-11 MED ORDER — ESCITALOPRAM OXALATE 10 MG PO TABS: 10.0000 mg | ORAL_TABLET | Freq: Every day | ORAL | 2 refills | Status: DC

## 2021-03-29 ENCOUNTER — Ambulatory Visit: Payer: BC Managed Care – PPO | Admitting: Nurse Practitioner

## 2021-05-30 ENCOUNTER — Other Ambulatory Visit: Payer: Self-pay | Admitting: Nurse Practitioner

## 2021-05-30 DIAGNOSIS — F411 Generalized anxiety disorder: Secondary | ICD-10-CM

## 2021-06-16 ENCOUNTER — Encounter: Payer: BC Managed Care – PPO | Admitting: Family Medicine

## 2021-10-11 ENCOUNTER — Other Ambulatory Visit: Payer: Self-pay | Admitting: Family Medicine

## 2021-10-11 DIAGNOSIS — F411 Generalized anxiety disorder: Secondary | ICD-10-CM

## 2021-10-12 NOTE — Telephone Encounter (Signed)
Refill request Lexapro Last refill 05/31/21 #30 Last office visit 02/08/22 Audria Nine NP Upcoming appointment 10/28/21

## 2021-10-28 ENCOUNTER — Encounter: Payer: BC Managed Care – PPO | Admitting: Family Medicine

## 2021-11-01 ENCOUNTER — Ambulatory Visit: Payer: BC Managed Care – PPO | Admitting: Family Medicine

## 2021-11-11 ENCOUNTER — Other Ambulatory Visit: Payer: Self-pay | Admitting: Family Medicine

## 2021-11-11 DIAGNOSIS — F411 Generalized anxiety disorder: Secondary | ICD-10-CM

## 2021-11-19 ENCOUNTER — Encounter: Payer: BC Managed Care – PPO | Admitting: Family Medicine

## 2021-12-01 ENCOUNTER — Encounter: Payer: BC Managed Care – PPO | Admitting: Family Medicine

## 2022-07-01 ENCOUNTER — Encounter: Payer: Self-pay | Admitting: Nurse Practitioner

## 2022-07-01 ENCOUNTER — Ambulatory Visit: Payer: BC Managed Care – PPO | Admitting: Nurse Practitioner

## 2022-07-01 VITALS — BP 110/62 | HR 75 | Temp 98.9°F | Resp 16 | Ht 70.0 in | Wt 187.1 lb

## 2022-07-01 DIAGNOSIS — F419 Anxiety disorder, unspecified: Secondary | ICD-10-CM

## 2022-07-01 DIAGNOSIS — F32A Depression, unspecified: Secondary | ICD-10-CM

## 2022-07-01 LAB — CBC
HCT: 44.6 % (ref 39.0–52.0)
Hemoglobin: 14.9 g/dL (ref 13.0–17.0)
MCHC: 33.5 g/dL (ref 30.0–36.0)
MCV: 91.8 fl (ref 78.0–100.0)
Platelets: 249 10*3/uL (ref 150.0–400.0)
RBC: 4.86 Mil/uL (ref 4.22–5.81)
RDW: 13.2 % (ref 11.5–15.5)
WBC: 5.9 10*3/uL (ref 4.0–10.5)

## 2022-07-01 LAB — COMPREHENSIVE METABOLIC PANEL
ALT: 17 U/L (ref 0–53)
AST: 19 U/L (ref 0–37)
Albumin: 4.3 g/dL (ref 3.5–5.2)
Alkaline Phosphatase: 69 U/L (ref 39–117)
BUN: 10 mg/dL (ref 6–23)
CO2: 30 mEq/L (ref 19–32)
Calcium: 9.2 mg/dL (ref 8.4–10.5)
Chloride: 101 mEq/L (ref 96–112)
Creatinine, Ser: 0.87 mg/dL (ref 0.40–1.50)
GFR: 99.77 mL/min (ref >60.00)
Glucose, Bld: 97 mg/dL (ref 70–99)
Potassium: 4.1 mEq/L (ref 3.5–5.1)
Sodium: 138 mEq/L (ref 135–145)
Total Bilirubin: 0.5 mg/dL (ref 0.2–1.2)
Total Protein: 7 g/dL (ref 6.0–8.3)

## 2022-07-01 LAB — TSH: TSH: 1.43 u[IU]/mL (ref 0.35–5.50)

## 2022-07-01 MED ORDER — FLUOXETINE HCL 10 MG PO CAPS: ORAL_CAPSULE | ORAL | 0 refills | Status: DC

## 2022-07-01 NOTE — Progress Notes (Signed)
Acute Office Visit  Subjective:     Patient ID: Jordan Braun, male    DOB: Aug 07, 1970, 52 y.o.   MRN: [id number]  Chief Complaint  Patient presents with   Anxiety    Getting worse over the last 2 weeks     Patient is in today for Anxiety with a history of GAD, insomnia and HNP  States that over the past few weeks he has been really down. States that he is worrying a lot but trying not to. States that he will feel ok and then go to worrying again. States finance and family. States that he feels like there are so many things are piling up. Like trying to figure out how to pay for things. States that he does feel like it effects work cause he sits there and thinks. States that he will wake up and be up for an hour and then go back to sleep.   States that as of late he has not been doing exercise as of late. States that he use to walk every Saturday and Sunday mornings.     07/01/2022    8:52 AM 02/08/2021    8:36 AM 12/28/2020    9:31 AM  PHQ9 SCORE ONLY  PHQ-9 Total Score 16 6 8        07/01/2022    8:52 AM 02/08/2021    8:37 AM 12/28/2020    9:32 AM 06/09/2020    9:56 AM  GAD 7 : Generalized Anxiety Score  Nervous, Anxious, on Edge 2 1 3 2   Control/stop worrying 2 0 2 1  Worry too much - different things 2 1 2 1   Trouble relaxing 2 0 3 1  Restless 0 0 1 0  Easily annoyed or irritable 2 1 3 2   Afraid - awful might happen 2 0 1 0  Total GAD 7 Score 12 3 15 7   Anxiety Difficulty Somewhat difficult Somewhat difficult Very difficult Somewhat difficult      Review of Systems  Constitutional:  Negative for chills and fever.  Respiratory:  Negative for shortness of breath.   Cardiovascular:  Negative for chest pain.  Neurological:  Negative for headaches.  Psychiatric/Behavioral:  Negative for hallucinations and suicidal ideas.         Objective:    BP 110/62   Pulse 75   Temp 98.9 F (37.2 C)   Resp 16   Ht 5\' 10"  (1.778 m)   Wt 187 lb 2 oz (84.9 kg)   SpO2  99%   BMI 26.85 kg/m    Physical Exam Vitals and nursing note reviewed.  Constitutional:      Appearance: Normal appearance.  Neck:     Thyroid: No thyroid mass or thyromegaly.  Cardiovascular:     Rate and Rhythm: Normal rate and regular rhythm.     Heart sounds: Normal heart sounds.  Pulmonary:     Effort: Pulmonary effort is normal.     Breath sounds: Normal breath sounds.  Lymphadenopathy:     Cervical: No cervical adenopathy.  Neurological:     Mental Status: He is alert.     No results found for any visits on 07/01/22.      Assessment & Plan:   Problem List Items Addressed This Visit       Other   Anxiety and depression - Primary    Patient tried Zoloft in the past and it worked well but caused sedation today.  Tried Lexapro without great benefit.  We will trial fluoxetine 10 mg nightly for 2 weeks titrate up to 20 mg nightly thereafter.  PHQ-9 and GAD-7 were administered in office.  Patient denies HI/SI/AVH.      Relevant Medications   FLUoxetine (PROZAC) 10 MG capsule   Other Relevant Orders   CBC   Comprehensive metabolic panel   TSH    Meds ordered this encounter  Medications   FLUoxetine (PROZAC) 10 MG capsule    Sig: Take 1 capsule (10 mg total) by mouth at bedtime for 15 days, THEN 2 capsules (20 mg total) at bedtime for 15 days.    Dispense:  45 capsule    Refill:  0    Order Specific Question:   Supervising Provider    Answer:   Roxy Manns A [[id number]]    Return in about 6 weeks (around 08/12/2022) for GAD/MDD and TOC.  Audria Nine, NP

## 2022-07-01 NOTE — Patient Instructions (Signed)
Nice to see you today I will be in touch with the labs once I have reviewed them Follow up with me in 6 weeks for a recheck and transfer of care appointment

## 2022-07-01 NOTE — Assessment & Plan Note (Signed)
Patient tried Zoloft in the past and it worked well but caused sedation today.  Tried Lexapro without great benefit.  We will trial fluoxetine 10 mg nightly for 2 weeks titrate up to 20 mg nightly thereafter.  PHQ-9 and GAD-7 were administered in office.  Patient denies HI/SI/AVH.

## 2022-07-15 ENCOUNTER — Telehealth: Payer: Self-pay

## 2022-07-15 NOTE — Telephone Encounter (Signed)
Prior Auth for Prozac indicates plan has a quantity limit of #1 tablet per day.  Original Claim Info EXCEEDS MAXIMUM QUANTITY ALLOWED MAXIMUM DAILY DOSE OF 1.000000  Please review medication and make any necessary changes to rx.  Thank you!

## 2022-07-18 NOTE — Telephone Encounter (Signed)
Patient returned call and stated that he has picked up the fluoxetine and has been taking it.

## 2022-07-18 NOTE — Telephone Encounter (Signed)
Left message to return call to our office.  

## 2022-07-18 NOTE — Telephone Encounter (Signed)
Can we see if he has picked up the fluoxetine. If he has not I will send in 20mg  tablets and change the instructions as it states that his insurance will not cover it the way it is written

## 2022-07-30 ENCOUNTER — Encounter: Payer: Self-pay | Admitting: Nurse Practitioner

## 2022-07-30 ENCOUNTER — Other Ambulatory Visit: Payer: Self-pay | Admitting: Nurse Practitioner

## 2022-07-30 DIAGNOSIS — F32A Depression, unspecified: Secondary | ICD-10-CM

## 2022-07-30 DIAGNOSIS — F419 Anxiety disorder, unspecified: Secondary | ICD-10-CM

## 2022-08-02 MED ORDER — FLUOXETINE HCL 20 MG PO CAPS: 20.0000 mg | ORAL_CAPSULE | Freq: Every day | ORAL | 0 refills | Status: DC

## 2022-08-16 ENCOUNTER — Other Ambulatory Visit: Payer: Self-pay | Admitting: Nurse Practitioner

## 2022-08-16 DIAGNOSIS — F32A Depression, unspecified: Secondary | ICD-10-CM

## 2022-08-24 ENCOUNTER — Ambulatory Visit: Payer: BC Managed Care – PPO | Admitting: Nurse Practitioner

## 2022-08-26 ENCOUNTER — Encounter: Payer: Self-pay | Admitting: Nurse Practitioner

## 2022-08-26 ENCOUNTER — Ambulatory Visit: Payer: BC Managed Care – PPO | Admitting: Nurse Practitioner

## 2022-08-26 VITALS — BP 110/80 | HR 72 | Temp 98.2°F | Resp 16 | Ht 70.0 in | Wt 179.2 lb

## 2022-08-26 DIAGNOSIS — Z125 Encounter for screening for malignant neoplasm of prostate: Secondary | ICD-10-CM

## 2022-08-26 DIAGNOSIS — Z13220 Encounter for screening for lipoid disorders: Secondary | ICD-10-CM

## 2022-08-26 DIAGNOSIS — Z23 Encounter for immunization: Secondary | ICD-10-CM

## 2022-08-26 DIAGNOSIS — Z0000 Encounter for general adult medical examination without abnormal findings: Secondary | ICD-10-CM | POA: Diagnosis not present

## 2022-08-26 DIAGNOSIS — F419 Anxiety disorder, unspecified: Secondary | ICD-10-CM | POA: Diagnosis not present

## 2022-08-26 DIAGNOSIS — F32A Depression, unspecified: Secondary | ICD-10-CM

## 2022-08-26 LAB — LIPID PANEL
Cholesterol: 192 mg/dL (ref 0–200)
HDL: 64.4 mg/dL (ref >39.00)
LDL Cholesterol: 111 mg/dL — ABNORMAL HIGH (ref 0–99)
NonHDL: 127.98
Total CHOL/HDL Ratio: 3
Triglycerides: 86 mg/dL (ref 0.0–149.0)
VLDL: 17.2 mg/dL (ref 0.0–40.0)

## 2022-08-26 LAB — PSA: PSA: 0.48 ng/mL (ref 0.10–4.00)

## 2022-08-26 MED ORDER — BUSPIRONE HCL 5 MG PO TABS: 5.0000 mg | ORAL_TABLET | Freq: Two times a day (BID) | ORAL | 1 refills | Status: DC

## 2022-08-26 NOTE — Patient Instructions (Addendum)
Nice to see you today I will be in touch with the labs once I have reviewed them  I want to see you in 6 weeks. It is ok to cancel the 09/20/2022 appt

## 2022-08-26 NOTE — Assessment & Plan Note (Signed)
Patient currently on fluoxetine 20 mg daily.  He is tolerating medication well but not feeling great effect.  We will add on buspirone 5 mg twice daily and keep fluoxetine where it sat.  Patient will follow-up in 6 weeks consider increasing fluoxetine or buspirone pending on how patient tolerates and responds.  Ambulatory referral to psychology today for therapy.  Patient does admit to having passive SI with no plan or intention.

## 2022-08-26 NOTE — Assessment & Plan Note (Signed)
Discussed age-appropriate immunizations and screening exams.  Did review patient's personal, surgical, social, family histories.  Did update patient's tetanus vaccine for shingles vaccine in office today.  Patient is up-to-date on CRC screening did draw PSA for prostate cancer screening.  Patient was given information at discharge about preventative healthcare maintenance with anticipatory guidance.

## 2022-08-26 NOTE — Progress Notes (Signed)
Established Patient Office Visit  Subjective   Patient ID: Jordan Braun, male    DOB: 1970-04-14  Age: 52 y.o. MRN: [id number]  Chief Complaint  Patient presents with   Anxiety   Depression   Establish Care    Transferring from Dr. Selena Batten      Transfer of care: last seen by me on 07/01/2022 also seen on 12/05/202. Gweneth Dimitri last visit was 12/28/2020   Anxiety/depression:  patient is currenlty maintained on fluoxetine 20mg  daily and is here for a recheck. States that he does not feel like it has been super effective. Statse that he feels tired int he beginning of the day and will take the medicaoitn 10-11 at night. States that he will go to sleep and wak up 330 and will be awake for an hour then will go back to sleep eventually. Most nights  States that he feels worried most times having a lack of motivation. States that he does have a break down approx once a week where he experiences passive SI. States that he is fearful of letting his family down. He is also worried as his child is starting college in August and the added expensive it will have.  Patients spouse is present with patient and states that she feels like he will use alcohol to self medicate on the weekends  for complete physical and follow up of chronic conditions.  Immunizations: -Tetanus:  update today -Influenza: Completed this season -Shingles: First vaccine today -Pneumonia: Too young - covid: pfizer with booster   Diet: Fair diet. States that he is eating 2 melas a day. Snacker. States water, sometimes coffee, and beer  Exercise: No regular exercise. Employment  Eye exam: contacts and yearly  Dental exam: Needs updating     Colonoscopy: Completed in 2021, recall in 10 years  Lung Cancer Screening: Does not qualify  PSA: Due      Review of Systems  Constitutional:  Negative for chills and fever.  Respiratory:  Negative for shortness of breath.   Cardiovascular:  Negative for chest pain and  leg swelling.  Gastrointestinal:  Negative for abdominal pain, blood in stool, constipation, diarrhea, nausea and vomiting.       Bm daily   Genitourinary:  Negative for dysuria and hematuria.  Neurological:  Negative for tingling and headaches.  Psychiatric/Behavioral:  Positive for depression. Negative for hallucinations and suicidal ideas.       Objective:     BP 110/80   Pulse 72   Temp 98.2 F (36.8 C)   Resp 16   Ht 5\' 10"  (1.778 m)   Wt 179 lb 4 oz (81.3 kg)   SpO2 98%   BMI 25.72 kg/m  BP Readings from Last 3 Encounters:  08/26/22 110/80  07/01/22 110/62  02/08/21 122/60   Wt Readings from Last 3 Encounters:  08/26/22 179 lb 4 oz (81.3 kg)  07/01/22 187 lb 2 oz (84.9 kg)  02/08/21 181 lb 6 oz (82.3 kg)      Physical Exam Vitals and nursing note reviewed.  Constitutional:      Appearance: Normal appearance.  HENT:     Right Ear: Tympanic membrane, ear canal and external ear normal.     Left Ear: Tympanic membrane, ear canal and external ear normal.     Mouth/Throat:     Mouth: Mucous membranes are moist.     Pharynx: Oropharynx is clear.  Eyes:     Extraocular Movements: Extraocular movements intact.  Pupils: Pupils are equal, round, and reactive to light.  Cardiovascular:     Rate and Rhythm: Normal rate and regular rhythm.     Pulses: Normal pulses.     Heart sounds: Normal heart sounds.  Pulmonary:     Effort: Pulmonary effort is normal.     Breath sounds: Normal breath sounds.  Abdominal:     General: Bowel sounds are normal. There is no distension.     Palpations: There is no mass.     Tenderness: There is no abdominal tenderness.     Hernia: No hernia is present.  Genitourinary:    Comments: Deferred  Musculoskeletal:     Right lower leg: No edema.     Left lower leg: No edema.  Lymphadenopathy:     Cervical: No cervical adenopathy.  Skin:    General: Skin is warm.  Neurological:     General: No focal deficit present.     Mental  Status: He is alert.     Deep Tendon Reflexes:     Reflex Scores:      Bicep reflexes are 2+ on the right side and 2+ on the left side.      Patellar reflexes are 2+ on the right side and 2+ on the left side.    Comments: Bilateral upper and lower extremity strength 5/5  Psychiatric:        Mood and Affect: Mood normal.        Behavior: Behavior normal.        Thought Content: Thought content normal.        Judgment: Judgment normal.      No results found for any visits on 08/26/22.    The ASCVD Risk score (Arnett DK, et al., 2019) failed to calculate for the following reasons:   The valid total cholesterol range is 130 to 320 mg/dL    Assessment & Plan:   Problem List Items Addressed This Visit       Other   Anxiety and depression - Primary    Patient currently on fluoxetine 20 mg daily.  He is tolerating medication well but not feeling great effect.  We will add on buspirone 5 mg twice daily and keep fluoxetine where it sat.  Patient will follow-up in 6 weeks consider increasing fluoxetine or buspirone pending on how patient tolerates and responds.  Ambulatory referral to psychology today for therapy.  Patient does admit to having passive SI with no plan or intention.      Relevant Medications   busPIRone (BUSPAR) 5 MG tablet   Other Relevant Orders   Ambulatory referral to Psychology   Preventative health care    Discussed age-appropriate immunizations and screening exams.  Did review patient's personal, surgical, social, family histories.  Did update patient's tetanus vaccine for shingles vaccine in office today.  Patient is up-to-date on CRC screening did draw PSA for prostate cancer screening.  Patient was given information at discharge about preventative healthcare maintenance with anticipatory guidance.      Other Visit Diagnoses     Screening for prostate cancer       Relevant Orders   PSA   Screening for lipid disorders       Relevant Orders   Lipid panel    Need for shingles vaccine       Relevant Orders   Zoster Recombinant (Shingrix ) (Completed)   Need for tetanus booster       Relevant Orders   Tdap vaccine greater  than or equal to 7yo IM (Completed)       Return in about 6 weeks (around 10/07/2022) for MDD/GAD.    Audria Nine, NP

## 2022-09-20 ENCOUNTER — Encounter: Payer: BC Managed Care – PPO | Admitting: Nurse Practitioner

## 2022-10-07 ENCOUNTER — Ambulatory Visit: Payer: BC Managed Care – PPO | Admitting: Nurse Practitioner

## 2022-10-20 ENCOUNTER — Encounter (INDEPENDENT_AMBULATORY_CARE_PROVIDER_SITE_OTHER): Payer: Self-pay

## 2022-10-31 ENCOUNTER — Other Ambulatory Visit: Payer: Self-pay | Admitting: Nurse Practitioner

## 2022-11-01 NOTE — Telephone Encounter (Signed)
LAST APPOINTMENT DATE: 08/26/2022 TOC    NEXT APPOINTMENT DATE: N/A     LAST REFILL: 08/02/22  QTY: 90 0rf

## 2022-11-30 ENCOUNTER — Ambulatory Visit: Payer: BC Managed Care – PPO

## 2022-11-30 DIAGNOSIS — Z23 Encounter for immunization: Secondary | ICD-10-CM

## 2022-11-30 NOTE — Progress Notes (Signed)
Per orders of Mordecai Maes, NP , injection of Shingrix given by Lewanda Rife in left deltoid. Patient tolerated injection well. Patient received 2nd shingrix injection today.

## 2023-02-21 ENCOUNTER — Other Ambulatory Visit: Payer: Self-pay | Admitting: Nurse Practitioner

## 2023-02-23 NOTE — Telephone Encounter (Signed)
Can we get patient scheduled within the next 30 days per my last office note

## 2023-02-28 NOTE — Telephone Encounter (Signed)
Attempted to call pt to schedule appointment.  LMTCB

## 2023-03-04 ENCOUNTER — Encounter: Payer: Self-pay | Admitting: Nurse Practitioner

## 2023-03-06 NOTE — Telephone Encounter (Signed)
Please call to make appointment changes.

## 2023-03-20 NOTE — Telephone Encounter (Signed)
 Appt scheduled for 03/31/23

## 2023-03-31 ENCOUNTER — Encounter: Payer: Self-pay | Admitting: Nurse Practitioner

## 2023-03-31 ENCOUNTER — Ambulatory Visit: Payer: BC Managed Care – PPO | Admitting: Nurse Practitioner

## 2023-03-31 VITALS — BP 136/84 | HR 90 | Temp 98.0°F | Ht 70.0 in | Wt 204.0 lb

## 2023-03-31 DIAGNOSIS — L989 Disorder of the skin and subcutaneous tissue, unspecified: Secondary | ICD-10-CM | POA: Insufficient documentation

## 2023-03-31 DIAGNOSIS — Z711 Person with feared health complaint in whom no diagnosis is made: Secondary | ICD-10-CM

## 2023-03-31 DIAGNOSIS — Z1321 Encounter for screening for nutritional disorder: Secondary | ICD-10-CM

## 2023-03-31 DIAGNOSIS — F411 Generalized anxiety disorder: Secondary | ICD-10-CM | POA: Diagnosis not present

## 2023-03-31 DIAGNOSIS — F32A Depression, unspecified: Secondary | ICD-10-CM

## 2023-03-31 DIAGNOSIS — F419 Anxiety disorder, unspecified: Secondary | ICD-10-CM

## 2023-03-31 MED ORDER — BUSPIRONE HCL 5 MG PO TABS: 5.0000 mg | ORAL_TABLET | Freq: Two times a day (BID) | ORAL | 1 refills | Status: DC

## 2023-03-31 NOTE — Patient Instructions (Signed)
Nice to see you today  I have sent in medication to the pharmacy  Follow up with me in 1 month, sooner if you need me

## 2023-03-31 NOTE — Assessment & Plan Note (Signed)
Patient with a lesion to superior medial back.  Amatory referral to dermatology

## 2023-03-31 NOTE — Progress Notes (Signed)
Established Patient Office Visit  Subjective   Patient ID: Jordan Braun, male    DOB: 1970-05-20  Age: 53 y.o. MRN: [id number]  Chief Complaint  Patient presents with   Anxiety    Not taking meds on a regular due to side eff    Discuss Memory   Check a mole on Back      Anxiety: patient was last seen by me on 08/26/2022 and was on prozac 20mg  and we did add on buspar 5mg  BID. He has not been adhernet to medication. State that he is taking the medications on occassionaly. States that he feels lik ehte medications wipe him out. State that he is unsure which is making him. He feels some better than before. He was referred to therapy and did not follow through. He would like [facility]. He is working a part ime and full time job. He states that he is sleeping well. On his days off he will get 10 hours. On his work days he will nap an d get approx 5 hours of sleep    Memory issues: he is having short term memory issues. States that he can remember certain things but other times not. Per spuose it started in August. She states that he forgot 5 bags of grocergies at the store. As of late he is not remembering a conversation. He is able to remember the tasks for work occultly.   He is eating 2 meals a day with snacks. He does drink water and was doing beer but switched to seltzer and drinks a 12 pack over the weekend  Mole: has been present for over 6 months. States that the spouse has noticed that it is black. Does not itch. May have grown some. No bleeding or discharge. Does not have a dermatologist       03/31/2023    3:32 PM 08/26/2022   12:13 PM 07/01/2022    8:52 AM  PHQ9 SCORE ONLY  PHQ-9 Total Score 5 16 16        03/31/2023    3:33 PM 08/26/2022   12:13 PM 07/01/2022    8:52 AM 02/08/2021    8:37 AM  GAD 7 : Generalized Anxiety Score  Nervous, Anxious, on Edge 1 2 2 1   Control/stop worrying 2 3 2  0  Worry too much - different things 2 3 2 1   Trouble relaxing 1 2 2  0  Restless  0 1 0 0  Easily annoyed or irritable 2 2 2 1   Afraid - awful might happen 2 2 2  0  Total GAD 7 Score 10 15 12 3   Anxiety Difficulty Somewhat difficult Very difficult Somewhat difficult Somewhat difficult       Review of Systems  Constitutional:  Negative for chills and fever.  Respiratory:  Negative for shortness of breath.   Cardiovascular:  Negative for chest pain.  Gastrointestinal:        Bm daily   Neurological:  Negative for dizziness and headaches.  Psychiatric/Behavioral:  Positive for memory loss. Negative for hallucinations and suicidal ideas. The patient does not have insomnia.       Objective:     BP 136/84 (BP Location: Left Arm, Patient Position: Sitting, Cuff Size: Normal)   Pulse 90   Temp 98 F (36.7 C) (Oral)   Ht 5\' 10"  (1.778 m)   Wt 204 lb (92.5 kg)   SpO2 96%   BMI 29.27 kg/m  BP Readings from Last 3 Encounters:  03/31/23 136/84  08/26/22 110/80  07/01/22 110/62   Wt Readings from Last 3 Encounters:  03/31/23 204 lb (92.5 kg)  08/26/22 179 lb 4 oz (81.3 kg)  07/01/22 187 lb 2 oz (84.9 kg)   SpO2 Readings from Last 3 Encounters:  03/31/23 96%  08/26/22 98%  07/01/22 99%      Physical Exam Vitals and nursing note reviewed.  Constitutional:      Appearance: Normal appearance.  HENT:     Mouth/Throat:     Mouth: Mucous membranes are moist.     Pharynx: Oropharynx is clear.  Eyes:     Pupils: Pupils are equal, round, and reactive to light.  Cardiovascular:     Rate and Rhythm: Normal rate and regular rhythm.     Heart sounds: Normal heart sounds.  Pulmonary:     Effort: Pulmonary effort is normal.     Breath sounds: Normal breath sounds.  Lymphadenopathy:     Cervical: No cervical adenopathy.  Skin:      Neurological:     General: No focal deficit present.     Mental Status: He is alert.     Cranial Nerves: Cranial nerves 2-12 are intact.     Sensory: Sensation is intact.     Motor: Motor function is intact.      Coordination: Coordination is intact.     Gait: Gait is intact.     Deep Tendon Reflexes:     Reflex Scores:      Bicep reflexes are 2+ on the right side and 2+ on the left side.      Patellar reflexes are 2+ on the right side and 2+ on the left side.    Comments: Bilateral upper and lower extremity strength 5/5.      No results found for any visits on 03/31/23.    The 10-year ASCVD risk score (Arnett DK, et al., 2019) is: 3.5%    Assessment & Plan:   Problem List Items Addressed This Visit       Musculoskeletal and Integument   Skin lesions   Patient with a lesion to superior medial back.  Amatory referral to dermatology      Relevant Orders   Ambulatory referral to Dermatology     Other   Anxiety and depression   History of same.  Patient tried sertraline and fluoxetine without great relief.  Patient also experienced adverse drug events.  Will try buspirone 5 mg twice daily.  Patient denies HI/SI/AVH.  Did administer PHQ-9 and GAD-7 in office.      Relevant Medications   busPIRone (BUSPAR) 5 MG tablet   Other Relevant Orders   Ambulatory referral to Psychology   Generalized anxiety disorder - Primary   History of the same will try buspirone 5 mg twice daily      Relevant Medications   busPIRone (BUSPAR) 5 MG tablet   Other Relevant Orders   CBC   Comprehensive metabolic panel   TSH   Concern about memory   Concern for short-term memory.  Patient is working 2 jobs and not getting adequate sleep.  He does drink plenty of fluids will check basic labs inclusive of CBC, CMP, TSH, B12 and vitamin D.  Patient neurologically intact      Relevant Orders   CBC   Comprehensive metabolic panel   TSH   Vitamin B12   VITAMIN D 25 Hydroxy (Vit-D Deficiency, Fractures)   Other Visit Diagnoses       Encounter for vitamin deficiency  screening       Relevant Orders   Vitamin B12   VITAMIN D 25 Hydroxy (Vit-D Deficiency, Fractures)       Return in about 4 weeks  (around 04/28/2023) for GAD recheck .    Audria Nine, NP

## 2023-03-31 NOTE — Assessment & Plan Note (Signed)
History of the same will try buspirone 5 mg twice daily

## 2023-03-31 NOTE — Assessment & Plan Note (Signed)
History of same.  Patient tried sertraline and fluoxetine without great relief.  Patient also experienced adverse drug events.  Will try buspirone 5 mg twice daily.  Patient denies HI/SI/AVH.  Did administer PHQ-9 and GAD-7 in office.

## 2023-03-31 NOTE — Assessment & Plan Note (Signed)
Concern for short-term memory.  Patient is working 2 jobs and not getting adequate sleep.  He does drink plenty of fluids will check basic labs inclusive of CBC, CMP, TSH, B12 and vitamin D.  Patient neurologically intact

## 2023-04-01 LAB — COMPREHENSIVE METABOLIC PANEL
AG Ratio: 1.4 (calc) (ref 1.0–2.5)
ALT: 21 U/L (ref 9–46)
AST: 21 U/L (ref 10–35)
Albumin: 4.2 g/dL (ref 3.6–5.1)
Alkaline phosphatase (APISO): 96 U/L (ref 35–144)
BUN: 12 mg/dL (ref 7–25)
CO2: 25 mmol/L (ref 20–32)
Calcium: 9 mg/dL (ref 8.6–10.3)
Chloride: 104 mmol/L (ref 98–110)
Creat: 0.89 mg/dL (ref 0.70–1.30)
Globulin: 2.9 g/dL (ref 1.9–3.7)
Glucose, Bld: 98 mg/dL (ref 65–99)
Potassium: 4.1 mmol/L (ref 3.5–5.3)
Sodium: 138 mmol/L (ref 135–146)
Total Bilirubin: 0.4 mg/dL (ref 0.2–1.2)
Total Protein: 7.1 g/dL (ref 6.1–8.1)

## 2023-04-01 LAB — VITAMIN D 25 HYDROXY (VIT D DEFICIENCY, FRACTURES): Vit D, 25-Hydroxy: 8 ng/mL — ABNORMAL LOW (ref 30–100)

## 2023-04-01 LAB — CBC
HCT: 43.5 % (ref 38.5–50.0)
Hemoglobin: 15.2 g/dL (ref 13.2–17.1)
MCH: 31.5 pg (ref 27.0–33.0)
MCHC: 34.9 g/dL (ref 32.0–36.0)
MCV: 90.1 fL (ref 80.0–100.0)
MPV: 10.5 fL (ref 7.5–12.5)
Platelets: 241 10*3/uL (ref 140–400)
RBC: 4.83 10*6/uL (ref 4.20–5.80)
RDW: 12.7 % (ref 11.0–15.0)
WBC: 6 10*3/uL (ref 3.8–10.8)

## 2023-04-01 LAB — VITAMIN B12: Vitamin B-12: 326 pg/mL (ref 200–1100)

## 2023-04-01 LAB — TSH: TSH: 1.59 m[IU]/L (ref 0.40–4.50)

## 2023-04-03 ENCOUNTER — Encounter: Payer: Self-pay | Admitting: Nurse Practitioner

## 2023-04-03 ENCOUNTER — Other Ambulatory Visit: Payer: Self-pay | Admitting: Nurse Practitioner

## 2023-04-03 DIAGNOSIS — E559 Vitamin D deficiency, unspecified: Secondary | ICD-10-CM

## 2023-04-03 MED ORDER — VITAMIN D (ERGOCALCIFEROL) 1.25 MG (50000 UNIT) PO CAPS: 50000.0000 [IU] | ORAL_CAPSULE | ORAL | 0 refills | Status: DC

## 2023-04-20 ENCOUNTER — Encounter: Payer: Self-pay | Admitting: *Deleted

## 2023-04-28 ENCOUNTER — Ambulatory Visit: Payer: BC Managed Care – PPO | Admitting: Nurse Practitioner

## 2023-04-28 VITALS — BP 126/72 | HR 82 | Temp 97.5°F | Ht 70.0 in | Wt 205.8 lb

## 2023-04-28 DIAGNOSIS — E663 Overweight: Secondary | ICD-10-CM | POA: Diagnosis not present

## 2023-04-28 DIAGNOSIS — Z711 Person with feared health complaint in whom no diagnosis is made: Secondary | ICD-10-CM

## 2023-04-28 DIAGNOSIS — F411 Generalized anxiety disorder: Secondary | ICD-10-CM | POA: Diagnosis not present

## 2023-04-28 NOTE — Progress Notes (Signed)
Established Patient Office Visit  Subjective   Patient ID: Jordan Braun, male    DOB: 02-27-1971  Age: 53 y.o. MRN: [id number]  Chief Complaint  Patient presents with   Follow-up    GAD. Pt complain of doing pretty good. States of no issues to buspar    HPI  Anxiety/depression: has history of the same. He has tried sertraline and fluoxetine without great relief and had ADE. He was placed on buspar 5mg  BID and referred to psychology.  States that he is taking buspar and not had any side effects. States that he is sleeping well. Still having some anxiety but it has improved. States that he has been reaching out to psychology but they keep playing "phone tag"  Memory concern: concerned about this last time. He is working 2 jobs and not getting adequate sleep. labs were reassuring sans vitamin D that was 8. He was started on replacement. States that since the last office visit he has not had any memory issues or trouble remembering conversations.  He also mentions that he got an over-the-counter multivitamin for men's that has been taking    Review of Systems  Constitutional:  Negative for chills and fever.  Respiratory:  Negative for shortness of breath.   Cardiovascular:  Negative for chest pain.  Neurological:  Negative for dizziness and headaches.  Psychiatric/Behavioral:  Negative for hallucinations and suicidal ideas. The patient does not have insomnia.       Objective:     BP 126/72   Pulse 82   Temp (!) 97.5 F (36.4 C) (Oral)   Ht 5\' 10"  (1.778 m)   Wt 205 lb 12.8 oz (93.4 kg)   SpO2 96%   BMI 29.53 kg/m  BP Readings from Last 3 Encounters:  04/28/23 126/72  03/31/23 136/84  08/26/22 110/80   Wt Readings from Last 3 Encounters:  04/28/23 205 lb 12.8 oz (93.4 kg)  03/31/23 204 lb (92.5 kg)  08/26/22 179 lb 4 oz (81.3 kg)   SpO2 Readings from Last 3 Encounters:  04/28/23 96%  03/31/23 96%  08/26/22 98%      Physical Exam Vitals and nursing note  reviewed.  Constitutional:      Appearance: Normal appearance.  Cardiovascular:     Rate and Rhythm: Normal rate and regular rhythm.     Heart sounds: Normal heart sounds.  Pulmonary:     Effort: Pulmonary effort is normal.     Breath sounds: Normal breath sounds.  Neurological:     Mental Status: He is alert.      No results found for any visits on 04/28/23.    The 10-year ASCVD risk score (Arnett DK, et al., 2019) is: 3%    Assessment & Plan:   Problem List Items Addressed This Visit       Other   Generalized anxiety disorder - Primary   Patient currently maintained on BuSpar 5 mg twice daily.  Has had some improvement with symptomology.  Would like to stay at the dose he is currently at.  Encourage patient to continue follow-up with psychiatry and to start therapy.  Patient denies HI/SI/AVH      Concern about memory   Basic labs were inconclusive vitamin D was very low and patient started on replacement.  Patient states memory has short up since last office visit.  No concern at this juncture.  Stable.      Overweight   Patient did have concerns in regards to recent weight increase.  We did discuss injectable medications that patient is not interested in.  Did discuss about eating 3 balanced meals a day and avoiding snacks.  Also physical activity as being a part of a healthy lifestyle.       Return in about 6 months (around 10/26/2023) for CPE and Labs.    Audria Nine, NP

## 2023-04-28 NOTE — Patient Instructions (Signed)
 Nice to see you today Follow up with me in 6 months, sooner if you need me

## 2023-04-28 NOTE — Assessment & Plan Note (Signed)
Basic labs were inconclusive vitamin D was very low and patient started on replacement.  Patient states memory has short up since last office visit.  No concern at this juncture.  Stable.

## 2023-04-28 NOTE — Assessment & Plan Note (Signed)
Patient did have concerns in regards to recent weight increase.  We did discuss injectable medications that patient is not interested in.  Did discuss about eating 3 balanced meals a day and avoiding snacks.  Also physical activity as being a part of a healthy lifestyle.

## 2023-04-28 NOTE — Assessment & Plan Note (Signed)
Patient currently maintained on BuSpar 5 mg twice daily.  Has had some improvement with symptomology.  Would like to stay at the dose he is currently at.  Encourage patient to continue follow-up with psychiatry and to start therapy.  Patient denies HI/SI/AVH

## 2023-05-19 ENCOUNTER — Ambulatory Visit: Payer: BC Managed Care – PPO | Admitting: Psychology

## 2023-05-19 DIAGNOSIS — F419 Anxiety disorder, unspecified: Secondary | ICD-10-CM | POA: Diagnosis not present

## 2023-05-19 DIAGNOSIS — F32A Depression, unspecified: Secondary | ICD-10-CM

## 2023-05-19 DIAGNOSIS — F411 Generalized anxiety disorder: Secondary | ICD-10-CM | POA: Diagnosis not present

## 2023-05-19 NOTE — Progress Notes (Signed)
 Comprehensive Clinical Assessment (CCA) Note  05/19/2023 Jordan Braun [id number]  Time Spent: 5:00 pm- 5:57 pm : 57 minutes  Chief Complaint: "There are times when I feel good, and then times when I feel bad. Morning time I feel more worried, and then afternoon I feel okay, then it happens all over again. My older brother Jordan Braun who I was close to, committed suicide 2018 and I never want to go down that path."  Visit Diagnosis: Anxiety and Depression 41.9 32.A Generalized Anxiety Disorder 41.1  Guardian/Payee:  Self    Paperwork requested: No   Reason for Visit /Presenting Problem: Feeling nervous and on edge, not being able to stop worrying, trouble relaxing, feeling down, feeling tired, feeling bad about himself, trouble concentrating.   Mental Status Exam: Appearance:   Well Groomed     Behavior:  Appropriate  Motor:  Normal  Speech/Language:   Normal Rate  Affect:  Appropriate  Mood:  normal  Thought process:  normal  Thought content:    WNL  Sensory/Perceptual disturbances:    WNL  Orientation:  oriented to person, place, and time/date  Attention:  Good  Concentration:  Good  Memory:  WNL  Fund of knowledge:   Good  Insight:    Good  Judgment:   Good  Impulse Control:  Good   Reported Symptoms: Feeling nervous and on edge, not being able to stop worrying, trouble relaxing, feeling down, feeling tired, feeling bad about himself, trouble concentrating.   Risk Assessment: Danger to Self:  No Self-injurious Behavior: No Danger to Others: No Duty to Warn:no Physical Aggression / Violence:No  Access to Firearms a concern: No  Gang Involvement:No  Patient / guardian was educated about steps to take if suicide or homicide risk level increases between visits: no While future psychiatric events cannot be accurately predicted, the patient does not currently require acute inpatient psychiatric care and does not currently meet [facility] involuntary commitment  criteria.  Substance Abuse History: Current substance abuse: No     Caffeine: just on the weekends Tobacco: No Alcohol: Few drinks during the week Substance use: No  Past Psychiatric History:   No previous psychological problems have been observed Outpatient Providers:N/A History of Psych Hospitalization: No  Psychological Testing:  N/A    Abuse History:  Victim of: No.,  N/A    Report needed: No. Victim of Neglect:No. Perpetrator of  No   Witness / Exposure to Domestic Violence: No   Protective Services Involvement: No  Witness to [employer] Violence:  No   Family History:  Family History  Problem Relation Age of Onset   Epilepsy Mother    Hyperlipidemia Father    Hypertension Father    Diabetes Father    Prostate cancer Father 10   Heart attack Father 41   Arthritis Father    Depression Brother    Leukemia Brother    Early death Brother    Depression Brother    Anxiety disorder Brother    Suicidality Brother    Depression Brother    Anxiety disorder Brother    Bipolar disorder Brother     Living situation: the patient lives with their wife, oldest daughter, Jordan Braun, graduated [employer], majors in Advertising copywriter, going back for her Master's in Advertising copywriter. His youngest daughter, Jordan Braun, is at [employer], she's very vocal. Wife is a great support system but she's worried about him, and he feels bad that she's worried.   Sexual Orientation: Straight  Relationship Status: married x  28 years in June Name of spouse / other:Jordan Braun If a parent, number of children / Two daughters who are college students  Support Systems: spouse, daughters, 2 brothers (one is a twin of the brother who suicided).   Financial Stress:  Yes   Income/Employment/Disability: Employment, works a full time job and a second part time job.  Military Service: No   Educational History: Education: some college, Scientist, research (physical sciences).   Religion/Sprituality/World View: Raised Catholic but have  "fallen away"  Any cultural differences that may affect / interfere with treatment:  not applicable   Recreation/Hobbies: Walking, spending time outside  Stressors: Loss of brother Jordan Braun, to suicide   Our society in general in 2025, I want to protect my kids from society.  Strengths: Supportive Relationships, family,   Barriers:  N/A   Legal History: Pending legal issue / charges: The patient has no significant history of legal issues. History of legal issue / charges:  N/A  Medical History/Surgical History: not reviewed Past Medical History:  Diagnosis Date   Anxiety    Depression    Medical history non-contributory     Past Surgical History:  Procedure Laterality Date   HERNIA REPAIR     right inguinal hernia at age 73   LUMBAR LAMINECTOMY Right 11/06/2013   Procedure: MICRODISCECTOMY LUMBAR LAMINECTOMY;  Surgeon: Eldred Manges, MD;  Location: [facility] OR;  Service: Orthopedics;  Laterality: Right;  Right L5-S1 Microdiscectomy   WISDOM TOOTH EXTRACTION      Medications: Current Outpatient Medications  Medication Sig Dispense Refill   busPIRone (BUSPAR) 5 MG tablet Take 1 tablet (5 mg total) by mouth 2 (two) times daily. 60 tablet 1   Vitamin D, Ergocalciferol, (DRISDOL) 1.25 MG (50000 UNIT) CAPS capsule Take 1 capsule (50,000 Units total) by mouth every 7 (seven) days. 12 capsule 0   No current facility-administered medications for this visit.    No Known Allergies  Diagnoses:  Anxiety and Depression 41.9 32.A Generalized Anxiety Disorder 41.1  Psychiatric Treatment: Yes , via PCP  Plan of Care: OPT  Narrative:   Jordan Braun participated from home, via video, is aware of tele-sessions limitations, and consented to treatment. Therapist participated from office. We reviewed the limits of confidentiality prior to the start of the evaluation. Jordan Braun expressed understanding and agreement to proceed.   Patient is a 53 year old male who presented for an  initial assessment. Patient was referred by his PCP [facility]. Patient reported the following symptoms: feeling nervous and on edge, not being able to stop worrying, trouble relaxing, feeling down, feeling tired, feeling bad about himself, trouble concentrating. Patient denied current and past suicidal ideation, homicidal ideation, and symptoms of psychosis.Patient denied current or past tobacco, alcohol, or drug use. Patient reported current stressors as the suicide of his brother, and working two jobs to pay for his second daughter's college tuition. Patient identified current supports as his spouse, his adult daughters, and his remaining two brothers.The circumstances that his brother suicided were particularly concerning for the patient and he reported "it's hard to get my mind around it. It's really taken a toll on me." Patient noted that his wife and two daughters were also very close to patient's deceased brother. Patient reported that his brother blamed him for deserting him when his brother was going through his legal battle from the concerning suicide. It was recommended that patient participate in outpatient therapy weekly/biweekly.   A follow-up was scheduled to create a treatment plan and begin treatment.  Therapist answered all questions during the evaluation and contact information was provided.      Helyn Numbers

## 2023-06-02 ENCOUNTER — Ambulatory Visit (INDEPENDENT_AMBULATORY_CARE_PROVIDER_SITE_OTHER): Admitting: Psychology

## 2023-06-02 DIAGNOSIS — F32A Depression, unspecified: Secondary | ICD-10-CM | POA: Diagnosis not present

## 2023-06-02 DIAGNOSIS — F411 Generalized anxiety disorder: Secondary | ICD-10-CM

## 2023-06-02 DIAGNOSIS — F419 Anxiety disorder, unspecified: Secondary | ICD-10-CM

## 2023-06-02 NOTE — Progress Notes (Signed)
 [facility] Counselor/Therapist Progress Note  Patient ID: Kamin Niblack, MRN: [id number]   Date: 06/02/23  Time Spent: 6:00 pm - 6:53  pm  : 53 minutes  Treatment Type: Individual Therapy.  Reported Symptoms: Feeling nervous and on edge, not being able to stop worrying, trouble relaxing, feeling down, feeling tired, feeling bad about himself, trouble concentrating.   Mental Status Exam:  Appearance:  Well Groomed     Behavior: Appropriate  Motor: Normal  Speech/Language:  Normal Rate  Affect: Appropriate  Mood: normal  Thought process: normal  Thought content:   WNL  Sensory/Perceptual disturbances:   WNL  Orientation: oriented to person, place, and time/date  Attention: Good  Concentration: Good  Memory: WNL  Fund of knowledge:  Good  Insight:   Good  Judgment:  Good  Impulse Control: Good   Risk Assessment: Danger to Self:  No Self-injurious Behavior: No Danger to Others: No Duty to Warn:no Physical Aggression / Violence:No  Access to Firearms a concern: No  Gang Involvement:No   Subjective:   Nicklous Kocak participated from home, via video and consented to treatment. Therapist participated from office. I discussed the limitations of evaluation and management by telemedicine and the availability of in person appointments. The patient expressed understanding and agreed to proceed. Rahim reviewed the events of the past week.   Patient reported that his eldest daughter's [place] birthday was last week, and she didn't hear from 42 2 brothers Kathlene November and Mellody Dance (gay and twin to Kevin--her uncles), Patient put a post on Facebook "didn't hear from her uncles", heard from Mike's wife that patient was wrong. Talked for a while about patient feeling dismissed, "we are estranged from each other". Patient believes that Kathlene November could have helped [place] before he died.   We reviewed numerous treatment approaches including CBT, BA, Problem Solving, and  Solution focused therapy.   Psych-education regarding the Nihar's diagnosis of Anxiety and Depression 41.9 32.A and Generalized  Anxiety Disorder 41.1 was provided during the session.   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   Interventions: Psycho-education & Goal Setting.   Diagnosis:  Anxiety and Depression 41.9 32.A Generalized Anxiety Disorder 41.1  Psychiatric Treatment: Yes , via PCP  Treatment Plan:  Client Abilities/Strengths Oday is intelligent, expressive, caring, and motivated.   Support System: Supportive relationships, spouse, and two daughters  Client Treatment Preferences OPT  Client Statement of Needs Sahand would like to return to school to get his MA degree in bs administration in an effort to change careers to better support himself and his family, to address change with a more healthy and effective approach, to develop a plan for future care challenges for older relatives.    Treatment Level Weekly/Biweekly  Symptoms  Anxiety: Feeling nervous and on edge, not being able to stop worrying, trouble relaxing. (Status: maintained) Depression: feeling down, feeling tired, feeling bad about himself, trouble concentrating. (Status: maintained)  Goals:   Bretton experiences symptoms of depression and anxiety  Treatment plan  signed and available on s-drive:  Yes    Target Date: 05/18/24 Frequency:  Weekly/Biweekly  Progress: 0 Modality: individual    Therapist will provide referrals for additional resources as appropriate.  Therapist will provide psycho-education regarding Jazion's diagnosis and corresponding treatment approaches and interventions. Helyn Numbers will support the patient's ability to achieve the goals identified. will employ CBT, BA, Problem-solving, Solution Focused, Mindfulness,  coping skills, & other evidenced-based practices will be used to promote progress towards healthy functioning to help manage decrease symptoms associated with their diagnosis.   Reduce overall level, frequency, and intensity of the feelings of depression, anxiety and panic evidenced by decreased overall symptoms from 6 to 7 days/week to 0 to 1 days/week per client report for at least 3 consecutive months. Verbally express understanding of the relationship between feelings of depression, and anxiety and their impact on thinking patterns and behaviors. Verbalize an understanding of the role that distorted thinking plays in creating fears, excessive worry, and ruminations.   (Prayan participated in the creation of the treatment plan)    Helyn Numbers

## 2023-06-14 ENCOUNTER — Ambulatory Visit: Admitting: Nurse Practitioner

## 2023-06-15 ENCOUNTER — Encounter: Payer: Self-pay | Admitting: Nurse Practitioner

## 2023-06-15 ENCOUNTER — Ambulatory Visit (INDEPENDENT_AMBULATORY_CARE_PROVIDER_SITE_OTHER)
Admission: RE | Admit: 2023-06-15 | Discharge: 2023-06-15 | Disposition: A | Discharge status: Home / Self Care | Source: Ambulatory Visit | Attending: Nurse Practitioner | Admitting: Nurse Practitioner

## 2023-06-15 ENCOUNTER — Ambulatory Visit: Admitting: Nurse Practitioner

## 2023-06-15 VITALS — BP 124/86 | HR 73 | Temp 97.9°F | Ht 70.0 in | Wt 201.0 lb

## 2023-06-15 DIAGNOSIS — R053 Chronic cough: Secondary | ICD-10-CM | POA: Insufficient documentation

## 2023-06-15 DIAGNOSIS — R0982 Postnasal drip: Secondary | ICD-10-CM | POA: Diagnosis not present

## 2023-06-15 DIAGNOSIS — E559 Vitamin D deficiency, unspecified: Secondary | ICD-10-CM | POA: Insufficient documentation

## 2023-06-15 MED ORDER — LEVOCETIRIZINE DIHYDROCHLORIDE 5 MG PO TABS: 5.0000 mg | ORAL_TABLET | Freq: Every evening | ORAL | 0 refills | Status: DC

## 2023-06-15 MED ORDER — FLUTICASONE PROPIONATE 50 MCG/ACT NA SUSP: 2.0000 | Freq: Every day | NASAL | 0 refills | Status: DC

## 2023-06-15 MED ORDER — VITAMIN D (ERGOCALCIFEROL) 1.25 MG (50000 UNIT) PO CAPS: 50000.0000 [IU] | ORAL_CAPSULE | ORAL | 0 refills | Status: DC

## 2023-06-15 NOTE — Assessment & Plan Note (Signed)
 Patient's vitamin D level was 8.  He has completed 12 weeks of vitamin D replacement we will continue for another 12 weeks.  Refill provided today

## 2023-06-15 NOTE — Assessment & Plan Note (Signed)
 Will do levocetirizine 5 mg nightly and Flonase 2 sprays each nostril daily 50 mcg per actuation.  This is likely the cause of his cough

## 2023-06-15 NOTE — Assessment & Plan Note (Signed)
 Will obtain chest x-ray to rule out infectious process.  Patient to take levocetirizine 5 mg nightly and fluticasone nasal spray 50 mcg per actuation 2 sprays each nostril daily.  Epistaxis precautions reviewed.  If no benefit consider treating heartburn with PPI

## 2023-06-15 NOTE — Progress Notes (Signed)
 Acute Office Visit  Subjective:     Patient ID: Jordan Braun, male    DOB: [birth year], 53 y.o.   MRN: [id number]  Chief Complaint  Patient presents with   Cough    Pt complains of persistent cough for 3 months. A Mix of dry cough and phlegm. Pt states of gagging cough that makes him feel like he is going to vomit. Chest pain only when taking deep breaths.    Medication Refill    Vitamin D.      Patient is in today for cough with history of HNP, insomnia, GAD, and memory concern  States that the cough has started approx 3 months ago. States that he is unsure of when it actually started. States that it is a dry cough that is deep to the point that he wants to gag/vomit. States that on ocassion he will get some clear phelgm. States that when he lays down he will get a tickle in the throat and has to cough several times. State that laying down dose trigger it. He can be in the car and have a coughing fit. State that work for the first 2-3 hours he is coughing and bring stuff up  He has tried delsym that did not seem to work. He has not had any sick contacts  Review of Systems  Constitutional:  Negative for chills and fever.  HENT:  Negative for ear discharge, ear pain, sinus pain and sore throat.   Respiratory:  Positive for cough and sputum production. Negative for shortness of breath.   Cardiovascular:  Negative for chest pain.  Gastrointestinal:  Negative for abdominal pain, diarrhea, nausea and vomiting.  Neurological:  Negative for dizziness and headaches.        Objective:    BP 124/86   Pulse 73   Temp 97.9 F (36.6 C) (Oral)   Ht 5\' 10"  (1.778 m)   Wt 201 lb (91.2 kg)   SpO2 96%   BMI 28.84 kg/m    Physical Exam Vitals and nursing note reviewed.  Constitutional:      Appearance: Normal appearance.  HENT:     Right Ear: Tympanic membrane, ear canal and external ear normal.     Left Ear: Tympanic membrane, ear canal and external ear normal.     Nose:      Right Sinus: No maxillary sinus tenderness or frontal sinus tenderness.     Left Sinus: No maxillary sinus tenderness or frontal sinus tenderness.     Mouth/Throat:     Mouth: Mucous membranes are moist.     Pharynx: Oropharynx is clear.  Cardiovascular:     Rate and Rhythm: Normal rate and regular rhythm.     Heart sounds: Normal heart sounds.  Pulmonary:     Effort: Pulmonary effort is normal.     Breath sounds: Normal breath sounds.  Lymphadenopathy:     Cervical: No cervical adenopathy.  Neurological:     Mental Status: He is alert.     No results found for any visits on 06/15/23.      Assessment & Plan:   Problem List Items Addressed This Visit       Other   Chronic cough - Primary   Will obtain chest x-ray to rule out infectious process.  Patient to take levocetirizine 5 mg nightly and fluticasone nasal spray 50 mcg per actuation 2 sprays each nostril daily.  Epistaxis precautions reviewed.  If no benefit consider treating heartburn with PPI  Relevant Medications   fluticasone (FLONASE) 50 MCG/ACT nasal spray   levocetirizine (XYZAL) 5 MG tablet   Other Relevant Orders   DG Chest 2 View (Completed)   PND (post-nasal drip)   Will do levocetirizine 5 mg nightly and Flonase 2 sprays each nostril daily 50 mcg per actuation.  This is likely the cause of his cough      Relevant Medications   fluticasone (FLONASE) 50 MCG/ACT nasal spray   levocetirizine (XYZAL) 5 MG tablet   Vitamin D deficiency   Patient's vitamin D level was 8.  He has completed 12 weeks of vitamin D replacement we will continue for another 12 weeks.  Refill provided today      Relevant Medications   Vitamin D, Ergocalciferol, (DRISDOL) 1.25 MG (50000 UNIT) CAPS capsule    Meds ordered this encounter  Medications   fluticasone (FLONASE) 50 MCG/ACT nasal spray    Sig: Place 2 sprays into both nostrils daily.    Dispense:  16 g    Refill:  0    Supervising Provider:   Roxy Manns A [[id number]]    levocetirizine (XYZAL) 5 MG tablet    Sig: Take 1 tablet (5 mg total) by mouth every evening.    Dispense:  30 tablet    Refill:  0    Supervising Provider:   Roxy Manns A [[id number]]   Vitamin D, Ergocalciferol, (DRISDOL) 1.25 MG (50000 UNIT) CAPS capsule    Sig: Take 1 capsule (50,000 Units total) by mouth every 7 (seven) days.    Dispense:  12 capsule    Refill:  0    Supervising Provider:   Roxy Manns A [[id number]]    Return if symptoms worsen or fail to improve, for As scheduled .  Audria Nine, NP

## 2023-06-15 NOTE — Patient Instructions (Signed)
 Nice to see you today I will be in touch with the xray once I have the results Follow up with me as scheduled

## 2023-06-16 ENCOUNTER — Ambulatory Visit (INDEPENDENT_AMBULATORY_CARE_PROVIDER_SITE_OTHER): Admitting: Psychology

## 2023-06-16 ENCOUNTER — Encounter: Payer: Self-pay | Admitting: Nurse Practitioner

## 2023-06-16 DIAGNOSIS — F411 Generalized anxiety disorder: Secondary | ICD-10-CM | POA: Diagnosis not present

## 2023-06-16 DIAGNOSIS — F32A Depression, unspecified: Secondary | ICD-10-CM

## 2023-06-16 NOTE — Progress Notes (Signed)
 [facility] Counselor/Therapist Progress Note  Patient ID: Jordan Braun, MRN: [id number]    Date: 06/16/23  Time Spent: 5:01 - 5:54 :53 minutes  Treatment Type: Individual Therapy.  Reported Symptoms:  Feeling nervous and on edge, not being able to stop worrying, trouble relaxing, feeling down, feeling tired, feeling bad about himself, trouble concentrating.   Mental Status Exam: Appearance:  Well Groomed     Behavior: Appropriate  Motor: Normal  Speech/Language:  Normal Rate  Affect: Appropriate  Mood: normal  Thought process: normal  Thought content:   WNL  Sensory/Perceptual disturbances:   WNL  Orientation: oriented to person, place, and time/date  Attention: Good  Concentration: Good  Memory: WNL  Fund of knowledge:  Good  Insight:   Good  Judgment:  Good  Impulse Control: Good   Risk Assessment: Danger to Self:  No Self-injurious Behavior: No Danger to Others: No Duty to Warn:no Physical Aggression / Violence:No  Access to Firearms a concern: No  Gang Involvement:No   Subjective:   Jordan Braun participated from home, via video, and consented to treatment. I discussed the limitations of evaluation and management by telemedicine and the availability of in person appointments. The patient expressed understanding and agreed to proceed. Therapist participated from office. Jordan Braun reviewed the events of the past week.   We reviewed his goals from two weeks ago, and patient prioritized those goals: Patient's top priority is selling his house so he and his wife can downsize. The timeframe for that goal is within the next two months. The next priority has two parts, the first part is supporting his daughters, one who is a first year in college and one who will be moving out of the family home.  The second part of the second priority goal is supporting his wife in her career. We discussed steps to achieving patient's top two priority goals.    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   Support System: Supportive relationships, spouse, and two daughters   Client Treatment Preferences OPT  Client Statement of Needs Jordan Braun would like to  return to school to get his MA degree in bs administration in an effort to change careers to better support himself and his family, to address change with a more healthy and effective approach, to develop a plan for future care challenges for older relatives.     Treatment Level Weekly/Biweekly  Symptoms Feeling nervous and on edge, not being able to stop worrying, trouble relaxing, feeling down, feeling tired, feeling bad about himself, trouble concentrating. (Status: maintained)  Goals:   Jordan Braun experiences symptoms of depression and anxiety   Target Date: 05/28/24 Frequency: Weekly/Biweekly  Progress: 0 Modality: individual    Therapist will provide referrals for additional resources as appropriate.  Therapist will provide psycho-education regarding Jordan Braun's diagnosis and corresponding treatment approaches and interventions. Jordan Braun will support the patient's ability  to achieve the goals identified. will employ CBT, BA, Problem-solving, Solution Focused, Mindfulness,  coping skills, & other  evidenced-based practices will be used to promote progress towards healthy functioning to help manage decrease symptoms associated with their diagnosis.   Reduce overall level, frequency, and intensity of the feelings of depression, anxiety and panic evidenced by decreased overall symptoms from 6 to 7 days/week to 0 to 1 days/week per client report for at least 3 consecutive months. Verbally express understanding of the relationship between feelings of depression and anxiety and their impact on thinking patterns and behaviors. Verbalize an understanding of the role that distorted thinking plays in creating fears, excessive worry, and ruminations.  (Jordan Braun participated in the creation of the treatment plan)    Jordan Braun

## 2023-06-26 ENCOUNTER — Other Ambulatory Visit: Payer: Self-pay | Admitting: Nurse Practitioner

## 2023-06-26 DIAGNOSIS — E559 Vitamin D deficiency, unspecified: Secondary | ICD-10-CM

## 2023-06-30 ENCOUNTER — Ambulatory Visit: Admitting: Psychology

## 2023-06-30 DIAGNOSIS — F419 Anxiety disorder, unspecified: Secondary | ICD-10-CM

## 2023-06-30 DIAGNOSIS — F32A Depression, unspecified: Secondary | ICD-10-CM

## 2023-06-30 DIAGNOSIS — F411 Generalized anxiety disorder: Secondary | ICD-10-CM

## 2023-06-30 NOTE — Progress Notes (Signed)
 [facility] Counselor/Therapist Progress Note  Patient ID: Duriel Stranger, MRN: [id number]    Date: 06/30/23  Time Spent: 5:00 pm - 5:54 pm :  54  minutes     Treatment Type: Individual Therapy.  Reported Symptoms: Feeling nervous and on edge, not being able to stop worrying, trouble relaxing, feeling down, feeling tired, feeling bad about himself, trouble concentrating.   Mental Status Exam:  Appearance:  Casual     Behavior: Appropriate  Motor: Normal  Speech/Language:  Normal Rate  Affect: Appropriate  Mood: normal  Thought process: normal  Thought content:   WNL  Sensory/Perceptual disturbances:   WNL  Orientation: oriented to person, place, and time/date  Attention: Good  Concentration: Good  Memory: WNL  Fund of knowledge:  Good  Insight:   Good  Judgment:  Good  Impulse Control: Good   Risk Assessment: Danger to Self:  No Self-injurious Behavior: No Danger to Others: No Duty to Warn:no Physical Aggression / Violence:No  Access to Firearms a concern: No  Gang Involvement:No   Subjective:   Seward Bevens participated from home, via video, and consented to treatment. I discussed the limitations of evaluation and management by telemedicine and the availability of in person appointments. The patient expressed understanding and agreed to proceed.  Therapist participated from home office.  Mechel reviewed the events of the past week.   Patient's eldest daughter, Grafton Lawrence, is moving out of the house on May 16. She is "only moving to [place]", but it's a major change. Margretta Shi is the younger daughter who will be home from college this summer. She'll be a sophomore when she returns to college in [place]. Patient and his wife have an anniversary trip coming up in June, and then a family trip to visit wife's family in [place] in July. Richad Champagne will accompany them but Grafton Lawrence will stay in [place] and take care of the family dog. Patient's jobs (full time and  part time), are both going well. Patient reported still thinking about his brother Ernestina Headland who suicided. Although he still thinks about Ernestina Headland, he thinks about him in a different way. He reported "a fractured relationship" with his other brothers. "Patient also noted that it's a mental tug of war." Patient suggested that we continue to meet every two weeks for a minimum of two more sessions. Then patient will revaluate   Interventions: Cognitive Behavioral Therapy  Diagnosis:  Anxiety and Depression 41.9 32.A Generalized Anxiety Disorder 41.1   Psychiatric Treatment: No   Treatment Plan:  Client Abilities/Strengths Johnnie is intelligent, expressive, caring, and motivated.   Support System: Supportive relationships, spouse, and two daughters   Client Treatment Preferences OPT  Client Statement of Needs Linkin would like to return to school to get his MA degree in bs administration in an effort to change careers to better support himself and his family, to address change with a more healthy and effective approach, to develop a plan for future care challenges for older relatives.     Treatment Level Weekly/Biweekly  Symptoms  Anxiety: Feeling nervous and on edge, not being able to stop worrying, trouble relaxing. (Status: maintained) Depression: feeling down, feeling tired, feeling bad about himself, trouble concentrating. (Status: maintained)  Goals:   Anel experiences symptoms of depression and anxiety.   Target Date: 05/18/24 Frequency: Weekly  Progress: 0 Modality: individual    Therapist will provide referrals for additional resources as appropriate.  Therapist will provide psycho-education regarding Lawyer's diagnosis and corresponding treatment approaches and interventions. Coralyn Derry  Tobias Forth will support the patient's ability to achieve the goals identified. will employ CBT, BA, Problem-solving, Solution Focused, Mindfulness,  coping skills, & other  evidenced-based practices will be used to promote progress towards healthy functioning to help manage decrease symptoms associated with their diagnosis.   Reduce overall level, frequency, and intensity of the feelings of depression, anxiety and panic evidenced by decreased overall symptoms from 6 to 7 days/week to 0 to 1 days/week per client report for at least 3 consecutive months. Verbally express understanding of the relationship between feelings of depression and anxiety and their impact on thinking patterns and behaviors. Verbalize an understanding of the role that distorted thinking plays in creating fears, excessive worry, and ruminations.  (Jerrik participated in the creation of the treatment plan)    Fran Imus

## 2023-07-14 ENCOUNTER — Other Ambulatory Visit: Payer: Self-pay | Admitting: Nurse Practitioner

## 2023-07-14 ENCOUNTER — Ambulatory Visit (INDEPENDENT_AMBULATORY_CARE_PROVIDER_SITE_OTHER): Admitting: Psychology

## 2023-07-14 DIAGNOSIS — R053 Chronic cough: Secondary | ICD-10-CM

## 2023-07-14 DIAGNOSIS — F32A Depression, unspecified: Secondary | ICD-10-CM

## 2023-07-14 DIAGNOSIS — F411 Generalized anxiety disorder: Secondary | ICD-10-CM | POA: Diagnosis not present

## 2023-07-14 DIAGNOSIS — R0982 Postnasal drip: Secondary | ICD-10-CM

## 2023-07-14 NOTE — Progress Notes (Signed)
 [facility] Counselor/Therapist Progress Note  Patient ID: Jordan Braun, MRN: [id number]    Date: 07/14/23  Time Spent: 5:00 pm - 5: 47 pm  : 47  minutes  Treatment Type: Individual Therapy.  Reported Symptoms: Feeling nervous and on edge, not being able to stop worrying, trouble relaxing, feeling down, feeling tired, feeling bad about himself, trouble concentrating.    Mental Status Exam: Appearance:  Casual     Behavior: Appropriate  Motor: Normal  Speech/Language:  Normal Rate  Affect: Appropriate  Mood: normal  Thought process: normal  Thought content:   WNL  Sensory/Perceptual disturbances:   WNL  Orientation: oriented to person, place, and time/date  Attention: Good  Concentration: Good  Memory: WNL  Fund of knowledge:  Good  Insight:   Good  Judgment:  Good  Impulse Control: Good   Risk Assessment: Danger to Self:  No Self-injurious Behavior: No Danger to Others: No Duty to Warn:no Physical Aggression / Violence:No  Access to Firearms a concern: No  Gang Involvement:No   Subjective:   Jordan Braun participated from home, via video, and consented to treatment. I discussed the limitations of evaluation and management by telemedicine and the availability of in person appointments. The patient expressed understanding and agreed to proceed.  Therapist participated from home office.  Jordan Braun reviewed the events of the past week.   Patient's daughter Jordan Braun has returned from college for the summer and is dating a transperson Jordan Braun. Daughter Jordan Braun is caught in the middle, because Kay's parents don't want Jordan Braun and Jordan Braun being together at each other's house. Patient requested to meet every 3 weeks for at least three more sessions.   Interventions: Cognitive Behavioral Therapy  Diagnosis:  Anxiety and Depression 41.9 32.A Generalized Anxiety Disorder 41.1  Psychiatric Treatment: No   Treatment Plan:  Client  Abilities/Strengths Jordan Braun is intelligent, expressive, caring, and motivated.   Support System: Supportive relationships, spouse, and two daughters   Client Treatment Preferences OPT  Client Statement of Needs Jordan Braun would like to return to school to get his MA degree in bs administration in an effort to change careers to better support himself and his family, to address change with a more healthy and effective approach, to develop a plan for future care challenges for older relatives.    Treatment Level Every 3 weeks  Symptoms  Anxiety: Feeling nervous and on edge, not being able to stop worrying, trouble relaxing. (Status: maintained) Depression: feeling down, feeling tired, feeling bad about himself, trouble concentrating. (Status: maintained)   Goals:   [name] experiences symptoms of depression and anxiety.   Target Date: 05/18/24 Frequency: Every three weeks  Progress: 0 Modality: individual    Therapist will provide referrals for additional resources as appropriate.  Therapist will provide psycho-education regarding Lum's diagnosis and corresponding treatment approaches and interventions. Jordan Braun will support the patient's ability to achieve the goals identified. will employ CBT, BA, Problem-solving, Solution Focused, Mindfulness,  coping skills, & other evidenced-based practices will be used to promote progress towards healthy functioning to help manage decrease symptoms associated with their diagnosis.   Reduce overall level, frequency, and intensity of the feelings of depression, anxiety and panic evidenced by decreased overall symptoms from 6 to 7 days/week to 0 to 1 days/week per client report for at least 3 consecutive months. Verbally express understanding of the relationship between feelings of depression, and anxiety and their impact on thinking patterns and behaviors. Verbalize an understanding of the role that distorted thinking  plays in  creating fears, excessive worry, and ruminations.  (Jordan Braun participated in the creation of the treatment plan)    Jordan Braun

## 2023-07-28 ENCOUNTER — Ambulatory Visit (INDEPENDENT_AMBULATORY_CARE_PROVIDER_SITE_OTHER): Admitting: Psychology

## 2023-07-28 DIAGNOSIS — F419 Anxiety disorder, unspecified: Secondary | ICD-10-CM

## 2023-07-28 DIAGNOSIS — F32A Depression, unspecified: Secondary | ICD-10-CM | POA: Diagnosis not present

## 2023-07-28 DIAGNOSIS — F411 Generalized anxiety disorder: Secondary | ICD-10-CM | POA: Diagnosis not present

## 2023-07-28 NOTE — Progress Notes (Unsigned)
 [facility] Counselor/Therapist Progress Note  Patient ID: Jordan Braun, MRN: [id number]    Date: 07/28/23  Time Spent: 5:00 pm - 5:53 pm : 53  minutes  Treatment Type: Individual Therapy.  Reported Symptoms: Feeling nervous and on edge, not being able to stop worrying, trouble relaxing, feeling down, feeling tired, feeling bad about himself, trouble concentrating.   Mental Status Exam: Appearance:  Well Groomed     Behavior: Appropriate  Motor: Normal  Speech/Language:  Normal Rate  Affect: Appropriate  Mood: normal  Thought process: normal  Thought content:   WNL  Sensory/Perceptual disturbances:   WNL  Orientation: oriented to person, place, and time/date  Attention: Good  Concentration: Good  Memory: WNL  Fund of knowledge:  Good  Insight:   Good  Judgment:  Good  Impulse Control: Good   Risk Assessment: Danger to Self:  No Self-injurious Behavior: No Danger to Others: No Duty to Warn:no Physical Aggression / Violence:No  Access to Firearms a concern: No  Gang Involvement:No   Subjective:   Eligha Britten participated from home, via video, and consented to treatment. I discussed the limitations of evaluation and management by telemedicine and the availability of in person appointments. The patient expressed understanding and agreed to proceed.  Therapist participated from home office.  Malvin reviewed the events of the past week.   Patient and his wife, Oralee Billow, are dealing with their eldest daughter, Grafton Lawrence, moving into her own apartment. Discussed patient and wife, Oralee Billow, having a date day this Sunday, because patient and wife are disconnected, and patient is worried about Grafton Lawrence slipping away from them.   Interventions: Cognitive Behavioral Therapy  Diagnosis:  Anxiety and Depression 41.9 32.A Generalized Anxiety Disorder 41.1  Psychiatric Treatment: No   Treatment Plan:  Client Abilities/Strengths Xzander is intelligent,  expressive, caring, and motivated.   Support System: Supportive relationships, spouse, and two daughters.  Client Treatment Preferences OPT  Client Statement of Needs Graylon would like to return to school to get his MA degree in bs administration in an effort to change careers to better support himself and his family, to address change with a more healthy and effective approach, to develop a plan for future care challenges for older relatives.     Treatment Level Every three weeks  Symptoms  Anxiety: Feeling nervous and on edge, not being able to stop worrying, trouble relaxing. (Status: maintained) Depression: feeling down, feeling tired, feeling bad about himself, trouble concentrating. (Status: maintained)  Goals:   Edmund experiences symptoms of depression and anxiety.   Target Date: 05/18/24 Frequency: every three weeks  Progress: 0 Modality: individual    Therapist will provide referrals for additional resources as appropriate.  Therapist will provide psycho-education regarding Lazarus's diagnosis and corresponding treatment approaches and interventions. Fran Imus will support the patient's ability to achieve the goals identified. will employ CBT, BA, Problem-solving, Solution Focused, Mindfulness,  coping skills, & other evidenced-based practices will be used to promote progress towards healthy functioning to help manage decrease symptoms associated with their diagnosis.   Reduce overall level, frequency, and intensity of the feelings of depression, anxiety and panic evidenced by decreased overall symptoms from 6 to 7 days/week to 0 to 1 days/week per client report for at least 3 consecutive months. Verbally express understanding of the relationship between feelings of depression and anxiety and their impact on thinking patterns and behaviors. Verbalize an understanding of the role that distorted thinking plays in creating fears, excessive worry, and  ruminations.  (  Cannan participated in the creation of the treatment plan)    Fran Imus

## 2023-08-11 ENCOUNTER — Ambulatory Visit: Admitting: Psychology

## 2023-08-18 ENCOUNTER — Ambulatory Visit (INDEPENDENT_AMBULATORY_CARE_PROVIDER_SITE_OTHER): Admitting: Psychology

## 2023-08-18 DIAGNOSIS — F32A Depression, unspecified: Secondary | ICD-10-CM | POA: Diagnosis not present

## 2023-08-18 DIAGNOSIS — F419 Anxiety disorder, unspecified: Secondary | ICD-10-CM | POA: Diagnosis not present

## 2023-08-18 NOTE — Progress Notes (Unsigned)
 [facility] Counselor/Therapist Progress Note  Patient ID: Jordan Braun, MRN: [id number]    Date: 08/18/23  Time Spent: 5:00 pm - 5:53 pm  : 53  minutes   Treatment Type: Individual Therapy.  Reported Symptoms:  Feeling nervous and on edge, not being able to stop worrying, trouble relaxing, feeling down, feeling tired, feeling bad about himself, trouble concentrating.   Mental Status Exam: Appearance:  Well Groomed     Behavior: Appropriate  Motor: Normal  Speech/Language:  Normal Rate  Affect: Appropriate  Mood: normal  Thought process: normal  Thought content:   WNL  Sensory/Perceptual disturbances:   WNL  Orientation: oriented to person, place, and time/date  Attention: Good  Concentration: Good  Memory: WNL  Fund of knowledge:  Good  Insight:   Good  Judgment:  Good  Impulse Control: Good   Risk Assessment: Danger to Self:  No Self-injurious Behavior: No Danger to Others: No Duty to Warn:no Physical Aggression / Violence:No  Access to Firearms a concern: No  Gang Involvement:No   Subjective:   Beaumont Rinella participated from home, via video, and consented to treatment. I discussed the limitations of evaluation and management by telemedicine and the availability of in person appointments. The patient expressed understanding and agreed to proceed. Therapist participated from home office. Dietrick reviewed the events of the past week.   Patient mentioned that Grafton Lawrence has moved out and her roommate is a very good male friend, Friday, Saturday, and Sunday are good days because patient and wife Pam have that time together. Have part time job until [place], possibly leave part time job before Margretta Shi goes back to college. Patient discussed family dynamics and future goals.   Interventions: Cognitive Behavioral Therapy  Diagnosis:  Anxiety and Depression 41.9 32.A  Psychiatric Treatment: No  Treatment Plan:  Client  Abilities/Strengths Kolston is intelligent, expressive, caring, and motivated.    Support System: Supportive relationships, spouse, and two daughters   Client Treatment Preferences OPT  Client Statement of Needs Kyler would like to return to school to get his MA degree in bs administration in an effort to change careers to better support himself and his family, to address change with a more healthy and effective approach, to develop a plan for future care challenges for older relatives.       Treatment Level Every 3 weeks  Symptoms  Anxiety: Feeling nervous and on edge, not being able to stop worrying, trouble relaxing. (Status: maintained) Depression: feeling down, feeling tired, feeling bad about himself, trouble concentrating. (Status: maintained)  Goals:   Arish experiences symptoms of depression and anxiety.  Target Date: 05/18/24 Frequency: Every three weeks.  Progress: 0 Modality: individual    Therapist will provide referrals for additional resources as appropriate.  Therapist will provide psycho-education regarding Sahib's diagnosis and corresponding treatment approaches and interventions. Fran Imus will support the patient's ability to achieve the goals identified. will employ CBT, BA, Problem-solving, Solution Focused, Mindfulness,  coping skills, & other evidenced-based practices will be used to promote progress towards healthy functioning to help manage decrease symptoms associated with their diagnosis.   Reduce overall level, frequency, and intensity of the feelings of depression, anxiety and panic evidenced by decreased overall symptoms from 6 to 7 days/week to 0 to 1 days/week per client report for at least 3 consecutive months. Verbally express understanding of the relationship between feelings of overall symptoms depression, and anxiety and their impact on thinking patterns and behaviors. Verbalize an understanding of the role  that distorted  thinking plays in creating fears, excessive worry, and ruminations.  (Aaronjames participated in the creation of the treatment plan)    Fran Imus

## 2023-08-25 ENCOUNTER — Ambulatory Visit: Admitting: Psychology

## 2023-09-01 ENCOUNTER — Ambulatory Visit: Admitting: Psychology

## 2023-09-06 ENCOUNTER — Other Ambulatory Visit: Payer: Self-pay | Admitting: Nurse Practitioner

## 2023-09-06 DIAGNOSIS — E559 Vitamin D deficiency, unspecified: Secondary | ICD-10-CM

## 2023-09-12 DIAGNOSIS — F419 Anxiety disorder, unspecified: Secondary | ICD-10-CM

## 2023-09-12 DIAGNOSIS — E559 Vitamin D deficiency, unspecified: Secondary | ICD-10-CM

## 2023-09-19 MED ORDER — VITAMIN D (ERGOCALCIFEROL) 1.25 MG (50000 UNIT) PO CAPS: 50000.0000 [IU] | ORAL_CAPSULE | ORAL | 0 refills | Status: AC

## 2023-09-19 MED ORDER — BUSPIRONE HCL 5 MG PO TABS: 5.0000 mg | ORAL_TABLET | Freq: Two times a day (BID) | ORAL | 1 refills | Status: DC

## 2023-09-22 ENCOUNTER — Ambulatory Visit (INDEPENDENT_AMBULATORY_CARE_PROVIDER_SITE_OTHER): Admitting: Psychology

## 2023-09-22 DIAGNOSIS — F32A Depression, unspecified: Secondary | ICD-10-CM

## 2023-09-22 DIAGNOSIS — F419 Anxiety disorder, unspecified: Secondary | ICD-10-CM | POA: Diagnosis not present

## 2023-09-22 NOTE — Progress Notes (Signed)
 [facility] Counselor/Therapist Progress Note  Patient ID: Jordan Braun, MRN: [id number]    Date: 09/22/23  Time Spent: 5:00 pm - 5:53 pm : 53  minutes  Treatment Type: Individual Therapy.  Reported Symptoms: Feeling nervous and on edge, not being able to stop worrying, trouble relaxing, feeling down, feeling tired, feeling bad about himself, trouble concentrating.    Mental Status Exam: Appearance:  Casual     Behavior: Appropriate  Motor: Normal  Speech/Language:  Normal Rate  Affect: Appropriate  Mood: normal  Thought process: normal  Thought content:   WNL  Sensory/Perceptual disturbances:   WNL  Orientation: oriented to person, place, and time/date  Attention: Good  Concentration: Good  Memory: WNL  Fund of knowledge:  Good  Insight:   Good  Judgment:  Good  Impulse Control: Good   Risk Assessment: Danger to Self:  No Self-injurious Behavior: No Danger to Others: No Duty to Warn:no Physical Aggression / Violence:No  Access to Firearms a concern: No  Gang Involvement:No   Subjective:   Jordan Braun participated from home, via video, and consented to treatment. I discussed the limitations of evaluation and management by telemedicine and the availability of in person appointments. The patient expressed understanding and agreed to proceed.  Therapist participated from home office.  Aladdin reviewed the events of the past week.   Patient and this therapist had not had a session since 6/13. Patient reported that Isaiah shared that she was bored. We discussed family dynamics regarding their family and Jordan Braun's family.  Interventions: Cognitive Behavioral Therapy  Diagnosis: Anxiety and Depression 41.9 32.A   Psychiatric Treatment: No   Treatment Plan:  Client Abilities/Strengths Jordan Braun  is intelligent, expressive, caring, and motivated.     Support System: Supportive relationships, spouse, and two daughters   Client Treatment  Preferences OPT  Client Statement of Needs Jordan Braun would like to return to school to get his MA degree in bs administration in an effort to change careers to better support himself and his family, to address change with a more healthy and effective approach, to develop a plan for future care challenges for older relatives.    Treatment Level Every 3 weeks.  Symptoms  Anxiety: Feeling nervous and on edge, not being able to stop worrying, trouble relaxing. (Status: maintained) Depression: feeling down, feeling tired, feeling bad about himself, trouble concentrating. (Status: maintained)  Goals:   Jordan Braun experiences symptoms of depression and anxiety   Target Date: 05/18/24 Frequency: Every 3 weeks.  Progress: 0 Modality: individual    Therapist will provide referrals for additional resources as appropriate.  Therapist will provide psycho-education regarding Jordan Braun's diagnosis and corresponding treatment approaches and interventions. Jenkins CHRISTELLA Nicolas will support the patient's ability to achieve the goals identified. will employ CBT, BA, Problem-solving, Solution Focused, Mindfulness,  coping skills, & other evidenced-based practices will be used to promote progress towards healthy functioning to help manage decrease symptoms associated with their diagnosis.   Reduce overall level, frequency, and intensity of the feelings of depression, anxiety and panic evidenced by decreased overall symptoms from 6 to 7 days/week to 0 to 1 days/week per client report for at least 3 consecutive months. Verbally express understanding of the relationship between feelings of depression and anxiety and their impact on thinking patterns and behaviors. Verbalize an understanding of the role that distorted thinking plays in creating fears, excessive worry, and ruminations.  (Britney participated in the creation of the treatment plan)    Jenkins CHRISTELLA Nicolas

## 2023-10-06 ENCOUNTER — Ambulatory Visit: Admitting: Psychology

## 2023-10-13 ENCOUNTER — Ambulatory Visit: Admitting: Psychology

## 2023-10-13 DIAGNOSIS — F32A Depression, unspecified: Secondary | ICD-10-CM

## 2023-10-13 DIAGNOSIS — F419 Anxiety disorder, unspecified: Secondary | ICD-10-CM

## 2023-10-13 NOTE — Progress Notes (Signed)
 [facility] Counselor/Therapist Progress Note  Patient ID: Jordan Braun, MRN: [id number]    Date: 10/13/23  Time Spent: 5:00 pm - 5:38 pm : 38 minutes   Treatment Type: Individual Therapy.  Reported Symptoms: Patient reported symptoms of anxiety and depression.  Mental Status Exam: Appearance:  Well Groomed     Behavior: Appropriate  Motor: Normal  Speech/Language:  Normal Rate  Affect: Appropriate  Mood: normal  Thought process: normal  Thought content:   WNL  Sensory/Perceptual disturbances:   WNL  Orientation: oriented to person, place, and time/date  Attention: Good  Concentration: Good  Memory: WNL  Fund of knowledge:  Good  Insight:   Good  Judgment:  Good  Impulse Control: Good   Risk Assessment: Danger to Self:  No Self-injurious Behavior: No Danger to Others: No Duty to Warn:no Physical Aggression / Violence:No  Access to Firearms a concern: No  Gang Involvement:No   Subjective:   Jordan Braun participated from home, via video, and consented to treatment. I discussed the limitations of evaluation and management by telemedicine and the availability of in person appointments. The patient expressed understanding and agreed to proceed.  Therapist participated from home office.  Kaicen reviewed the events of the past week.   Patient reported that his daughter Jordan Braun is moving back to school next Wednesday and her significant other Jordan Braun is going back next Thursday and Friday. Patient's oldest daughter, Jordan Braun, will continue to work full time and will start graduate school part time.    Interventions: Cognitive Behavioral Therapy  Diagnosis: Anxiety and Depression 41.9 32.A   Psychiatric Treatment: No   Treatment Plan:  Client Abilities/Strengths Jordan Braun is intelligent, expressive, caring, and motivated.   Support System: Supportive relationships, spouse, and two daughters  Client Treatment Preferences OPT  Client  Statement of Needs Jordan Braun would like to  return to school to get his MA degree in bs administration in an effort to change careers to better support himself and his family, to address change with a more healthy and effective approach, to develop a plan for future care challenges for older relatives.       Treatment Level Every 4-6 weeks   Symptoms  Symptoms of anxiety and depression (Status; maintained)  Goals:   Jordan Braun experiences symptoms of depression and anxiety.   Target Date: 05/18/24 Frequency: 4-6 weeks  Progress: 0 Modality: individual    Therapist will provide referrals for additional resources as appropriate.  Therapist will provide psycho-education regarding Alverto's diagnosis and corresponding treatment approaches and interventions. Jenkins CHRISTELLA Nicolas will support the patient's ability to achieve the goals identified. will employ CBT, BA, Problem-solving, Solution Focused, Mindfulness,  coping skills, & other evidenced-based practices will be used to promote progress towards healthy functioning to help manage decrease symptoms associated with their diagnosis.   Reduce overall level, frequency, and intensity of the feelings of depression, anxiety and panic evidenced by decreased overall symptoms from 6 to 7 days/week to 0 to 1 days/week per client report for at least 3 consecutive months. Verbally express understanding of the relationship between feelings of depression and anxiety and their impact on thinking patterns and behaviors. Verbalize an understanding of the role that distorted thinking plays in creating fears, excessive worry, and ruminations.  (Corliss participated in the creation of the treatment plan)    Jenkins CHRISTELLA Nicolas

## 2023-10-20 ENCOUNTER — Ambulatory Visit: Admitting: Psychology

## 2023-10-26 ENCOUNTER — Encounter: Payer: BC Managed Care – PPO | Admitting: Nurse Practitioner

## 2023-11-03 ENCOUNTER — Ambulatory Visit: Admitting: Psychology

## 2023-11-13 ENCOUNTER — Ambulatory Visit: Admitting: Nurse Practitioner

## 2023-11-13 VITALS — BP 114/76 | HR 77 | Temp 98.1°F | Ht 70.25 in | Wt 201.8 lb

## 2023-11-13 DIAGNOSIS — F32A Depression, unspecified: Secondary | ICD-10-CM

## 2023-11-13 DIAGNOSIS — E663 Overweight: Secondary | ICD-10-CM

## 2023-11-13 DIAGNOSIS — Z125 Encounter for screening for malignant neoplasm of prostate: Secondary | ICD-10-CM

## 2023-11-13 DIAGNOSIS — Z87891 Personal history of nicotine dependence: Secondary | ICD-10-CM | POA: Diagnosis not present

## 2023-11-13 DIAGNOSIS — E7800 Pure hypercholesterolemia, unspecified: Secondary | ICD-10-CM | POA: Diagnosis not present

## 2023-11-13 DIAGNOSIS — Z8042 Family history of malignant neoplasm of prostate: Secondary | ICD-10-CM

## 2023-11-13 DIAGNOSIS — Z0000 Encounter for general adult medical examination without abnormal findings: Secondary | ICD-10-CM

## 2023-11-13 DIAGNOSIS — Z131 Encounter for screening for diabetes mellitus: Secondary | ICD-10-CM | POA: Diagnosis not present

## 2023-11-13 DIAGNOSIS — F411 Generalized anxiety disorder: Secondary | ICD-10-CM

## 2023-11-13 DIAGNOSIS — E559 Vitamin D deficiency, unspecified: Secondary | ICD-10-CM

## 2023-11-13 DIAGNOSIS — Z23 Encounter for immunization: Secondary | ICD-10-CM | POA: Diagnosis not present

## 2023-11-13 DIAGNOSIS — F419 Anxiety disorder, unspecified: Secondary | ICD-10-CM

## 2023-11-13 LAB — CBC WITH DIFFERENTIAL/PLATELET
Basophils Absolute: 0.1 K/uL (ref 0.0–0.1)
Basophils Relative: 1 % (ref 0.0–3.0)
Eosinophils Absolute: 0.2 K/uL (ref 0.0–0.7)
Eosinophils Relative: 3.2 % (ref 0.0–5.0)
HCT: 43.2 % (ref 39.0–52.0)
Hemoglobin: 14.5 g/dL (ref 13.0–17.0)
Lymphocytes Relative: 21 % (ref 12.0–46.0)
Lymphs Abs: 1.3 K/uL (ref 0.7–4.0)
MCHC: 33.6 g/dL (ref 30.0–36.0)
MCV: 91.3 fl (ref 78.0–100.0)
Monocytes Absolute: 0.6 K/uL (ref 0.1–1.0)
Monocytes Relative: 9 % (ref 3.0–12.0)
Neutro Abs: 4.1 K/uL (ref 1.4–7.7)
Neutrophils Relative %: 65.8 % (ref 43.0–77.0)
Platelets: 214 K/uL (ref 150.0–400.0)
RBC: 4.73 Mil/uL (ref 4.22–5.81)
RDW: 13 % (ref 11.5–15.5)
WBC: 6.2 K/uL (ref 4.0–10.5)

## 2023-11-13 LAB — LIPID PANEL
Cholesterol: 182 mg/dL (ref 0–200)
HDL: 52.7 mg/dL (ref >39.00)
LDL Cholesterol: 104 mg/dL — ABNORMAL HIGH (ref 0–99)
NonHDL: 129.67
Total CHOL/HDL Ratio: 3
Triglycerides: 126 mg/dL (ref 0.0–149.0)
VLDL: 25.2 mg/dL (ref 0.0–40.0)

## 2023-11-13 LAB — COMPREHENSIVE METABOLIC PANEL WITH GFR
ALT: 18 U/L (ref 0–53)
AST: 17 U/L (ref 0–37)
Albumin: 4.2 g/dL (ref 3.5–5.2)
Alkaline Phosphatase: 76 U/L (ref 39–117)
BUN: 11 mg/dL (ref 6–23)
CO2: 32 meq/L (ref 19–32)
Calcium: 9.5 mg/dL (ref 8.4–10.5)
Chloride: 100 meq/L (ref 96–112)
Creatinine, Ser: 0.87 mg/dL (ref 0.40–1.50)
GFR: 98.81 mL/min (ref >60.00)
Glucose, Bld: 83 mg/dL (ref 70–99)
Potassium: 4.2 meq/L (ref 3.5–5.1)
Sodium: 138 meq/L (ref 135–145)
Total Bilirubin: 0.5 mg/dL (ref 0.2–1.2)
Total Protein: 7 g/dL (ref 6.0–8.3)

## 2023-11-13 LAB — URINALYSIS, MICROSCOPIC ONLY
RBC / HPF: NONE SEEN (ref 0–?)
WBC, UA: NONE SEEN (ref 0–?)

## 2023-11-13 LAB — PSA: PSA: 0.58 ng/mL (ref 0.10–4.00)

## 2023-11-13 LAB — VITAMIN D 25 HYDROXY (VIT D DEFICIENCY, FRACTURES): VITD: 46.18 ng/mL (ref 30.00–100.00)

## 2023-11-13 LAB — TSH: TSH: 2.64 u[IU]/mL (ref 0.35–5.50)

## 2023-11-13 LAB — HEMOGLOBIN A1C: Hgb A1c MFr Bld: 6 % (ref 4.6–6.5)

## 2023-11-13 MED ORDER — BUSPIRONE HCL 5 MG PO TABS: 5.0000 mg | ORAL_TABLET | Freq: Two times a day (BID) | ORAL | 3 refills | Status: AC

## 2023-11-13 NOTE — Assessment & Plan Note (Signed)
 Pending urine microscopy to rule out microscopic hematuria

## 2023-11-13 NOTE — Assessment & Plan Note (Signed)
 Pending TSH, A1c, lipid panel.

## 2023-11-13 NOTE — Assessment & Plan Note (Signed)
 History of the same.  Will check vitamin D  level and determine whether patient needs new prescription therapy or over-the-counter therapy.  Pending vitamin D  level today

## 2023-11-13 NOTE — Assessment & Plan Note (Signed)
 History of the same pending lipid panel today

## 2023-11-13 NOTE — Progress Notes (Signed)
 Established Patient Office Visit  Subjective   Patient ID: Jordan Braun, male    DOB: 09/25/1970  Age: 53 y.o. MRN: [id number]  Chief Complaint  Patient presents with   Annual Exam    Would like flu shot   Medication Refill    Vitamin D  and Buspar      HPI  Anxiety: Patient currently maintained on BuSpar  5 mg twice daily. States that he is doing pretty good overall. States that he is doing therapy session on Fridays. States that it is currently twice   for complete physical and follow up of chronic conditions.  Immunizations: -Tetanus: Completed in 2024 -Influenza: up date today  -Shingles: Completed Shingrix  series -Pneumonia:update today  Diet: Fair diet. He is doing 2 meals and sometimes a third meal. He sill have a snack instead. He is drinking coffee and water  Exercise: No regular exercise. Walking 1.5 miles on the weekends   Eye exam: Completes annually.contacts. Coming up  Dental exam: needs updating    Colonoscopy: Completed in 12/26/2019? Lung Cancer Screening: N/A  PSA: Due  Sleep: goes to bed around 1130 and will get up 445. Does not feel rested. Does snore       Review of Systems  Constitutional:  Negative for chills and fever.  Respiratory:  Negative for shortness of breath.   Cardiovascular:  Negative for chest pain and leg swelling.  Gastrointestinal:  Negative for abdominal pain, blood in stool, constipation, diarrhea, nausea and vomiting.       BM daily   Genitourinary:  Negative for dysuria and hematuria.  Neurological:  Negative for dizziness, tingling and headaches.  Psychiatric/Behavioral:  Negative for hallucinations and suicidal ideas.       Objective:     BP 114/76   Pulse 77   Temp 98.1 F (36.7 C) (Oral)   Ht 5' 10.25 (1.784 m)   Wt 201 lb 12.8 oz (91.5 kg)   SpO2 97%   BMI 28.75 kg/m  BP Readings from Last 3 Encounters:  11/13/23 114/76  06/15/23 124/86  04/28/23 126/72   Wt Readings from Last 3 Encounters:   11/13/23 201 lb 12.8 oz (91.5 kg)  06/15/23 201 lb (91.2 kg)  04/28/23 205 lb 12.8 oz (93.4 kg)   SpO2 Readings from Last 3 Encounters:  11/13/23 97%  06/15/23 96%  04/28/23 96%      Physical Exam Vitals and nursing note reviewed.  Constitutional:      Appearance: Normal appearance.  HENT:     Right Ear: Tympanic membrane, ear canal and external ear normal.     Left Ear: Tympanic membrane, ear canal and external ear normal.     Mouth/Throat:     Mouth: Mucous membranes are moist.     Pharynx: Oropharynx is clear.  Eyes:     Extraocular Movements: Extraocular movements intact.     Pupils: Pupils are equal, round, and reactive to light.  Cardiovascular:     Rate and Rhythm: Normal rate and regular rhythm.     Pulses: Normal pulses.     Heart sounds: Normal heart sounds.  Pulmonary:     Effort: Pulmonary effort is normal.     Breath sounds: Normal breath sounds.  Abdominal:     General: Bowel sounds are normal. There is no distension.     Palpations: There is no mass.     Tenderness: There is no abdominal tenderness.     Hernia: No hernia is present.  Genitourinary:    Comments:  deferred Musculoskeletal:     Right lower leg: No edema.     Left lower leg: No edema.  Lymphadenopathy:     Cervical: No cervical adenopathy.  Skin:    General: Skin is warm.  Neurological:     General: No focal deficit present.     Mental Status: He is alert.     Deep Tendon Reflexes:     Reflex Scores:      Bicep reflexes are 2+ on the right side and 2+ on the left side.      Patellar reflexes are 2+ on the right side and 2+ on the left side.    Comments: Bilateral upper and lower extremity strength 5/5  Psychiatric:        Mood and Affect: Mood normal.        Behavior: Behavior normal.        Thought Content: Thought content normal.        Judgment: Judgment normal.      No results found for any visits on 11/13/23.    The 10-year ASCVD risk score (Arnett DK, et al., 2019)  is: 2.8%    Assessment & Plan:   Problem List Items Addressed This Visit       Other   Anxiety and depression   Relevant Medications   busPIRone  (BUSPAR ) 5 MG tablet   Family history of prostate cancer   Relevant Orders   PSA   Generalized anxiety disorder   Currently maintained on BuSpar  5 mg twice daily and topical therapy.  Stable at this juncture.  Patient denies HI/SI/AVH.  Continue BuSpar  5 mg twice daily.  Refill provided today      Relevant Medications   busPIRone  (BUSPAR ) 5 MG tablet   Other Relevant Orders   TSH   Preventative health care - Primary   Discussed age-appropriate immunizations and screening exams.  Did review patient's personal, surgical, social, family histories.  Patient is up-to-date with all age-appropriate vaccinations he would like.  Update flu vaccine and pneumonia vaccine (Prevnar 20).  Patient up-to-date on CRC screening.  PSA for prostate cancer screening today.  Patient was given information at discharge about preventative healthcare maintenance with anticipatory guidance.      Relevant Orders   CBC with Differential/Platelet   Comprehensive metabolic panel with GFR   TSH   Overweight   Pending TSH, A1c, lipid panel.      Vitamin D  deficiency   History of the same.  Will check vitamin D  level and determine whether patient needs new prescription therapy or over-the-counter therapy.  Pending vitamin D  level today      Relevant Orders   VITAMIN D  25 Hydroxy (Vit-D Deficiency, Fractures)   Elevated LDL cholesterol level   History of the same pending lipid panel today      Relevant Orders   Lipid panel   Former smoker   Pending urine microscopy to rule out microscopic hematuria      Relevant Orders   Urine Microscopic   Other Visit Diagnoses       Screening for prostate cancer       Relevant Orders   PSA     Need for pneumococcal 20-valent conjugate vaccination       Relevant Orders   Pneumococcal conjugate vaccine 20-valent  (Prevnar 20) (Completed)     Screening for diabetes mellitus       Relevant Orders   Hemoglobin A1c     Need for influenza vaccination  Relevant Orders   Flu vaccine trivalent PF, 6mos and older(Flulaval,Afluria,Fluarix,Fluzone) (Completed)       Return in about 1 year (around 11/12/2024) for CPE and Labs.    Adina Crandall, NP

## 2023-11-13 NOTE — Assessment & Plan Note (Signed)
 Discussed age-appropriate immunizations and screening exams.  Did review patient's personal, surgical, social, family histories.  Patient is up-to-date with all age-appropriate vaccinations he would like.  Update flu vaccine and pneumonia vaccine (Prevnar 20).  Patient up-to-date on CRC screening.  PSA for prostate cancer screening today.  Patient was given information at discharge about preventative healthcare maintenance with anticipatory guidance.

## 2023-11-13 NOTE — Assessment & Plan Note (Signed)
 Currently maintained on BuSpar  5 mg twice daily and topical therapy.  Stable at this juncture.  Patient denies HI/SI/AVH.  Continue BuSpar  5 mg twice daily.  Refill provided today

## 2023-11-13 NOTE — Patient Instructions (Signed)
 Nice to see you today  We updated your pneumonia (prevnar 20) and flu vaccine today I will be in touch with the labs once I have reviewed them  Follow up with me in 1 year, sooner if you need me

## 2023-11-14 ENCOUNTER — Other Ambulatory Visit: Payer: Self-pay | Admitting: Nurse Practitioner

## 2023-11-14 DIAGNOSIS — E559 Vitamin D deficiency, unspecified: Secondary | ICD-10-CM

## 2023-11-16 ENCOUNTER — Ambulatory Visit: Payer: Self-pay | Admitting: Nurse Practitioner

## 2023-11-16 DIAGNOSIS — R7303 Prediabetes: Secondary | ICD-10-CM | POA: Insufficient documentation

## 2023-11-17 ENCOUNTER — Telehealth: Payer: Self-pay

## 2023-11-17 ENCOUNTER — Ambulatory Visit: Admitting: Psychology

## 2023-11-17 NOTE — Telephone Encounter (Signed)
 He needs to switch to over the counter vitamin D  at 1000IU daily

## 2023-11-17 NOTE — Telephone Encounter (Signed)
 LAST APPOINTMENT DATE: 11/14/2023 NEXT APPOINTMENT DATE: Visit date not found  Vitamin D2 50,000IU (Ergo)  LAST REFILL: 09/19/2023 QTY: 8 capsules, 0RF

## 2023-12-08 ENCOUNTER — Ambulatory Visit (INDEPENDENT_AMBULATORY_CARE_PROVIDER_SITE_OTHER): Admitting: Psychology

## 2023-12-08 DIAGNOSIS — F419 Anxiety disorder, unspecified: Secondary | ICD-10-CM

## 2023-12-08 DIAGNOSIS — F32A Depression, unspecified: Secondary | ICD-10-CM | POA: Diagnosis not present

## 2023-12-08 DIAGNOSIS — F411 Generalized anxiety disorder: Secondary | ICD-10-CM

## 2023-12-08 NOTE — Progress Notes (Signed)
 [facility] Counselor/Therapist Progress Note  Patient ID: Jordan Braun, MRN: [id number]    Date: 12/08/23  Time Spent: 5:59 pm - 6:54 pm: 55 minutes  Treatment Type: Individual Therapy.  Reported Symptoms/Chief Complaint : Patient and his family have gone through a lot of changes since our last session on October 13, 2023.   Mental Status Exam: Appearance:  Casual     Behavior: Sharing  Motor: Normal  Speech/Language:  Clear and Coherent  Affect: Appropriate  Mood: anxious  Thought process: normal  Thought content:   WNL  Sensory/Perceptual disturbances:   WNL  Orientation: oriented to person, place, and time/date  Attention: Good  Concentration: Good  Memory: WNL  Fund of knowledge:  Good  Insight:   Good  Judgment:  Good  Impulse Control: Good   Risk Assessment: Danger to Self: No Self-injurious Behavior: No Danger to Others: No Duty to Warn:no Physical Aggression / Violence:No  Access to Firearms a concern: No  Gang Involvement:No   Subjective:   Jordan Braun participated from home, via video, and consented to treatment. I discussed the limitations of evaluation and management by telemedicine and the availability of in person appointments. The patient expressed understanding and agreed to proceed.  Therapist participated from home office.  Deen reviewed the events of the past week.   Patient reported that he and his family have gone through a lot of changes since our last session on October 13, 2023. (Patient had to cancel a couple of sessions due to his travel and his schedule.) Patient has made significant healthy changes both emotionally and physically in his life.   Interventions: Cognitive Behavioral Therapy  Diagnosis: Anxiety and Depression 41.9 32 A  Psychiatric Treatment: No   Treatment Plan:  Client Abilities/Strengths Jordan Braun is intelligent, expressive, caring, and motivated  Support System: Supportive relationships,  spouse, caring, and motivated  Client Treatment Preferences OPT  Client Statement of Needs Jordan Braun would like to return to school to get his MA degree in bs administration in an effort to change careers to better support himself and his family, to address change with a more healthy and effective approach, to develop a plan for future care challenges for older relatives.    Treatment Level Every 4-6 weeks.  Symptoms   Jordan Braun experiences symptoms of anxiety and depression (Status: maintained)  Goals:  Target Date: 05/18/24 Frequency: 4-6 weeks  Progress: 25% Modality: individual    Therapist will provide referrals for additional resources as appropriate.  Therapist will provide psycho-education regarding Jordan Braun's diagnosis and corresponding treatment approaches and interventions. Jenkins CHRISTELLA Nicolas will support the patient's ability to achieve the goals identified. will employ CBT, BA, Problem-solving, Solution Focused, Mindfulness,  coping skills, & other evidenced-based practices will be used to promote progress towards healthy functioning to help manage decrease symptoms associated with their diagnosis.   Reduce overall level, frequency, and intensity of the feelings of depression, anxiety and panic evidenced by decreased overall symptoms from 6 to 7 days/week to 0 to 1 days/week per client report for at least 3 consecutive months. Verbally express understanding of the relationship between feelings of depression and anxiety and their impact on thinking patterns and behaviors. Verbalize an understanding of the role that distorted thinking plays in creating fears, excessive worry, and ruminations.  (Jordan Braun participated in the creation of the treatment plan)    Jenkins CHRISTELLA Nicolas

## 2024-01-12 ENCOUNTER — Ambulatory Visit: Admitting: Psychology

## 2024-02-09 ENCOUNTER — Ambulatory Visit: Admitting: Psychology

## 2024-02-09 DIAGNOSIS — F419 Anxiety disorder, unspecified: Secondary | ICD-10-CM

## 2024-02-09 NOTE — Progress Notes (Signed)
 [facility] Counselor/Therapist Progress Note  Patient ID: Jordan Braun, MRN: [id number]    Date: 02/09/24  Time Spent: 4:01 pm - 4:54 pm : 53 minutes  Treatment Type: Individual Therapy.  Reported Symptoms: Patient learned this week that he will no longer have a full time job as of Fall 2026. Patient was surprised but not shocked.   Mental Status Exam: Appearance:  NA     Behavior: Sharing  Motor: Normal  Speech/Language:  Normal Rate  Affect: Appropriate  Mood: anxious  Thought process: normal  Thought content:   WNL  Sensory/Perceptual disturbances:   WNL  Orientation: oriented to person, place, and time/date  Attention: Good  Concentration: Good  Memory: WNL  Fund of knowledge:  Good  Insight:   Good  Judgment:  Good  Impulse Control: Good   Risk Assessment: Danger to Self:  No Self-injurious Behavior: No Danger to Others: No Duty to Warn:no Physical Aggression / Violence:No  Access to Firearms a concern: No  Gang Involvement:No   Subjective:   Jordan Braun participated from home, via video, and consented to treatment. I discussed the limitations of evaluation and management by telemedicine and the availability of in person appointments. The patient expressed understanding and agreed to proceed.  Therapist participated from home office.  Jordan Braun reviewed the events of the past week.   Patient reported that he will be out of a job within 10 months.They will give us  60 days notice if they close before October 2026. I am not totally depressed, but I am not happy about having wasted 27 years of my life at this job. 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 I repair pin bars. 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 Example, working the [employer] in [place] would be my dream job. Three people who work at my current job have left and gone to  [employer]. We discussed resources available to patient for job searches. I got the offer to test for the electrical part of the job. 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   Interventions: Cognitive Behavioral Therapy  Diagnosis: Anxiety and Depression 41.9 and 32 A  Psychiatric Treatment: No   Treatment Plan:  Client Abilities/Strengths Jordan Braun is intelligent, expressive, caring, and motivated.  Support System: Spouse and daughters   Client Treatment Preferences OPT  Client Statement of Needs Jordan Braun would like to explore future plans.    Treatment Level Every 2 weeks  Symptoms  Jordan Braun experiences symptoms of anxiety and depression (Status: maintained)  Goals:   Target Date: 05/18/24 Frequency: every 2 weeks  Progress: 50% Modality: individual    Therapist will provide referrals for additional resources as appropriate.  Therapist will provide psycho-education regarding Jordan Braun's diagnosis and corresponding treatment approaches and interventions. Jenkins CHRISTELLA Nicolas will support the patient's ability to achieve the goals identified. will employ CBT, BA, Problem-solving, Solution Focused, Mindfulness,  coping skills, & other evidenced-based practices will be used to promote progress towards healthy functioning to help manage decrease symptoms associated with their diagnosis.   Reduce overall level, frequency, and intensity of the feelings of depression, anxiety and panic evidenced by decreased overall symptoms from 6 to 7 days/week to 0 to 1 days/week per client report for at least 3 consecutive months. Verbally express understanding of the relationship between feelings of depression and anxiety and their impact on  thinking patterns and behaviors. Verbalize an understanding of the role that distorted thinking plays in creating fears, excessive worry, and ruminations.  ([name] participated in the creation of the  treatment plan)    Jenkins CHRISTELLA Nicolas

## 2024-02-14 ENCOUNTER — Encounter: Payer: Self-pay | Admitting: Nurse Practitioner

## 2024-02-23 ENCOUNTER — Ambulatory Visit: Admitting: Psychology

## 2024-02-23 DIAGNOSIS — F419 Anxiety disorder, unspecified: Secondary | ICD-10-CM

## 2024-02-23 NOTE — Progress Notes (Signed)
 [facility] Counselor/Therapist Progress Note  Patient ID: ISAUL LANDI MRN: [id number]    Date: 02/23/2024  Time Spent: 4:00 pm - 4:53 pm : 53 minutes  Treatment Type: Individual Therapy.  Reported Symptoms: Patient reported feeling worried and down about having to start a new job search after just learning earlier this month that he will lose his job next year.  Mental Status Exam: Appearance:  NA     Behavior: Hesitant  Motor: Normal  Speech/Language:  Clear and Coherent  Affect: Constricted  Mood: anxious  Thought process: normal  Thought content:   WNL  Sensory/Perceptual disturbances:   WNL  Orientation: oriented to person, place, and time/date  Attention: Good  Concentration: Good  Memory: WNL  Fund of knowledge:  Good  Insight:   Good  Judgment:  Good  Impulse Control: Good   Risk Assessment: Danger to Self:  No Self-injurious Behavior: No Danger to Others: No Duty to Warn:no Physical Aggression / Violence:No  Access to Firearms a concern: No  Gang Involvement:No   Subjective:   Lamone Lapier participated from home, via video, and consented to treatment. I discussed the limitations of evaluation and management by telemedicine and the availability of in person appointments. The patient expressed understanding and agreed to proceed. Therapist participated from home office. Monica reviewed the events of the past week.   Patient is currently working at [employer] and he needs to find a new job because the company is letting everyone go next year. Patient reported feeling down about being let go by an employer he'd had for 27 years, and he was worried and stressed about not being able to provide for his family. We discussed resources he could utilize for resume' and job finding help. Patient felt he had what he needed to take the next steps to finding a job. Patient will reach out to this provider in the future for any additional help he might need.    Interventions: Cognitive Behavioral Therapy  Diagnosis: Anxiety and Depression 41.9 and 32 A  Psychiatric Treatment: No   Treatment Plan:  Client Abilities/Strengths Jacey is intelligent, expressive, caring, and motivated  Support System: Spouse and 2 adult daughters  Client Treatment Preferences OPT  Client Statement of Needs Jantzen would like to explore future plans.   Treatment Level Patient will reach out as needed.   Symptoms  Monica experiences symptoms of anxiety and depression (Status: maintained).  Goals:  Target Date: 05/18/24 Frequency: Patient will reach out as needed.   Progress: 65% Modality: individual    Therapist will provide referrals for additional resources as appropriate.  Therapist will provide psycho-education regarding Hank's diagnosis and corresponding treatment approaches and interventions. Jenkins CHRISTELLA Nicolas will support the patient's ability to achieve the goals identified. will employ CBT, BA, Problem-solving, Solution Focused, Mindfulness,  coping skills, & other evidenced-based practices will be used to promote progress towards healthy functioning to help manage decrease symptoms associated with their diagnosis.   Reduce overall level, frequency, and intensity of the feelings of depression, anxiety and panic evidenced by decreased overall symptoms from 6 to 7 days/week to 0 to 1 days/week per client report for at least 3 consecutive months. Verbally express understanding of the relationship between feelings of depression and anxiety and their impact on thinking patterns and behaviors. Verbalize an understanding of the role that distorted thinking plays in creating fears, excessive worry, and ruminations.  (Hatim participated in the creation of the treatment plan)    Jenkins CHRISTELLA Nicolas

## 2024-03-13 NOTE — Addendum Note (Signed)
 Addended by: MAUDIE JENKINS HERO on: 03/13/2024 10:45 AM   Modules accepted: Level of Service
